# Patient Record
Sex: Female | Born: 2000 | Race: Black or African American | Hispanic: Yes | Marital: Married | Smoking: Former smoker
Health system: Southern US, Community
[De-identification: ages and names within clinical notes are randomized; demographics above are authoritative.]

## PROBLEM LIST (undated history)

## (undated) ENCOUNTER — Inpatient Hospital Stay (HOSPITAL_COMMUNITY): Payer: Self-pay

## (undated) DIAGNOSIS — O039 Complete or unspecified spontaneous abortion without complication: Secondary | ICD-10-CM

## (undated) DIAGNOSIS — J45909 Unspecified asthma, uncomplicated: Secondary | ICD-10-CM

## (undated) DIAGNOSIS — N83209 Unspecified ovarian cyst, unspecified side: Secondary | ICD-10-CM

## (undated) HISTORY — DX: Complete or unspecified spontaneous abortion without complication: O03.9

## (undated) HISTORY — DX: Unspecified ovarian cyst, unspecified side: N83.209

## (undated) HISTORY — PX: NO PAST SURGERIES: SHX2092

## (undated) HISTORY — PX: WISDOM TOOTH EXTRACTION: SHX21

---
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
## 2018-05-03 ENCOUNTER — Other Ambulatory Visit: Payer: Self-pay

## 2018-05-03 ENCOUNTER — Encounter: Payer: Self-pay | Admitting: Adult Health

## 2018-05-03 ENCOUNTER — Ambulatory Visit (INDEPENDENT_AMBULATORY_CARE_PROVIDER_SITE_OTHER): Payer: Medicaid Other | Admitting: Adult Health

## 2018-05-03 VITALS — BP 113/66 | HR 77 | Ht 63.0 in | Wt 127.0 lb

## 2018-05-03 DIAGNOSIS — Z3202 Encounter for pregnancy test, result negative: Secondary | ICD-10-CM

## 2018-05-03 DIAGNOSIS — Z113 Encounter for screening for infections with a predominantly sexual mode of transmission: Secondary | ICD-10-CM

## 2018-05-03 DIAGNOSIS — Z30011 Encounter for initial prescription of contraceptive pills: Secondary | ICD-10-CM

## 2018-05-03 DIAGNOSIS — H52203 Unspecified astigmatism, bilateral: Secondary | ICD-10-CM | POA: Diagnosis not present

## 2018-05-03 DIAGNOSIS — H5213 Myopia, bilateral: Secondary | ICD-10-CM | POA: Diagnosis not present

## 2018-05-03 LAB — POCT URINE PREGNANCY: PREG TEST UR: NEGATIVE

## 2018-05-03 MED ORDER — NORETHIN-ETH ESTRAD-FE BIPHAS 1 MG-10 MCG / 10 MCG PO TABS
1.0000 | ORAL_TABLET | Freq: Every day | ORAL | 11 refills | Status: DC
Start: 2018-05-03 — End: 2019-03-29

## 2018-05-03 NOTE — Progress Notes (Signed)
  Subjective:     Patient ID: Jasmine Pierce, female   DOB: 05/21/2001, 17 y.o.   MRN: [id number]  HPI Jasmine Pierce is a 17 year old black female, in requesting STD screening and to get on OCs, has used in past.No complaints.   Review of Systems Patient denies any headaches, hearing loss, fatigue, blurred vision, shortness of breath, chest pain, abdominal pain, problems with bowel movements, urination, or intercourse. No joint pain or mood swings. Reviewed past medical,surgical, social and family history. Reviewed medications and allergies.     Objective:   Physical Exam BP 113/66 (BP Location: Right Arm, Patient Position: Sitting, Cuff Size: Normal)   Pulse 77   Ht 5\' 3"  (1.6 m)   Wt 127 lb (57.6 kg)   LMP 04/13/2018   BMI 22.50 kg/m UPT negative.Skin warm and dry. Neck: mid line trachea, normal thyroid, good ROM, no lymphadenopathy noted. Lungs: clear to ausculation bilaterally. Cardiovascular: regular rate and rhythm. Pelvic: external genitalia is normal in appearance no lesions, vagina: scant white discharge without odor,urethra has no lesions or masses noted, cervix:smooth, uterus: normal size, shape and contour, non tender, no masses felt, adnexa: no masses or tenderness noted. Bladder is non tender and no masses felt.  Nuswab obtained. PHQ 2 score 0.     Assessment:     1. Encounter for initial prescription of contraceptive pills   2. Pregnancy examination or test, negative result   3. Screening examination for STD (sexually transmitted disease)       Plan:    1 pack lo loestrin given to start today, use condoms Meds ordered this encounter  Medications  . Norethindrone-Ethinyl Estradiol-Fe Biphas (LO LOESTRIN FE) 1 MG-10 MCG / 10 MCG tablet    Sig: Take 1 tablet by mouth daily. Take 1 daily by mouth    Dispense:  1 Package    Refill:  11    BIN [id number], PCN [id number], GRP [id number],ID [id number]    Order Specific Question:   Supervising Provider    Answer:   Lazaro ArmsEURE, LUTHER H [[id number]]   Nuswab sent Check HIV and RPR F/U in 3 months

## 2018-05-04 LAB — HIV ANTIBODY (ROUTINE TESTING W REFLEX): HIV SCREEN 4TH GENERATION: NONREACTIVE

## 2018-05-04 LAB — RPR: RPR Ser Ql: NONREACTIVE

## 2018-05-08 LAB — NUSWAB VAGINITIS PLUS (VG+)
Candida albicans, NAA: NEGATIVE
Candida glabrata, NAA: NEGATIVE
Chlamydia trachomatis, NAA: NEGATIVE
NEISSERIA GONORRHOEAE, NAA: NEGATIVE
Trich vag by NAA: NEGATIVE

## 2019-03-28 ENCOUNTER — Telehealth: Payer: Self-pay | Admitting: Adult Health

## 2019-03-28 NOTE — Telephone Encounter (Signed)
We have you scheduled for an upcoming appointment at our office. At this time, we are still not allowing any visitors or children to come in with you during your appointment time unless you need physical assistance. We understand this may be different from your past appointments and know this may be difficult but our goal is to keep everyone safe.  ° °We ask if you have had any exposure to anyone suspected or confirmed of having COVID-19 or if you are experiencing any of the following, to call and reschedule your appointment: fever, cough, shortness of breath, muscle pain, diarrhea, rash, vomiting, abdominal pain, red eye, weakness, bruising, bleeding, joint pain, or a severe headache.  ° °Also,to keep you safe, please use the provided hand sanitizer when you enter the office. We are asking everyone in the office to wear a mask to help prevent the spread of °germs. If you have a mask of your own, please wear it to your appointment, if not, we are happy to provide one for you. ° °We ask that you complete your E-check-in via mychart prior to your arrival and check-in via Hello Patient and call our office when you arrive in our office parking lot to complete your registration over the phone.  This is to help speed up the check-in process and reduce patient volume in our office.   ° °Please know we will ask you these questions or similar questions when you arrive for your appointment and again it’s how we are keeping everyone safe.  Thank you for understanding and your cooperation.  °

## 2019-03-29 ENCOUNTER — Other Ambulatory Visit: Payer: Self-pay

## 2019-03-29 ENCOUNTER — Ambulatory Visit (INDEPENDENT_AMBULATORY_CARE_PROVIDER_SITE_OTHER): Payer: Medicaid Other | Admitting: Adult Health

## 2019-03-29 ENCOUNTER — Encounter: Payer: Self-pay | Admitting: Adult Health

## 2019-03-29 VITALS — Ht 63.0 in

## 2019-03-29 DIAGNOSIS — Z3201 Encounter for pregnancy test, result positive: Secondary | ICD-10-CM | POA: Insufficient documentation

## 2019-03-29 DIAGNOSIS — O3680X0 Pregnancy with inconclusive fetal viability, not applicable or unspecified: Secondary | ICD-10-CM

## 2019-03-29 DIAGNOSIS — Z3A01 Less than 8 weeks gestation of pregnancy: Secondary | ICD-10-CM | POA: Insufficient documentation

## 2019-03-29 MED ORDER — PRENATAL PLUS 27-1 MG PO TABS: 1.0000 | ORAL_TABLET | Freq: Every day | ORAL | 12 refills | Status: DC

## 2019-03-29 NOTE — Progress Notes (Signed)
Patient ID: Jasmine Pierce, female   DOB: 07/26/2001, 18 y.o.   MRN: [id number]   TELEHEALTH VIRTUAL GYNECOLOGY VISIT ENCOUNTER NOTE  I connected with Jasmine Pierce on 03/31/19 at  1:45 PM EDT by telephone at home and verified that I am speaking with the correct person using two identifiers.   I discussed the limitations, risks, security and privacy concerns of performing an evaluation and management service by telephone and the availability of in person appointments. I also discussed with the patient that there may be a patient responsible charge related to this service. The patient expressed understanding and agreed to proceed.   History:  Jasmine Pierce is a 18 y.o. G1P0000,black female,single, being evaluated today for missed period and 5+HPTs, about 4+4 weeks by LMP 02/25/19, with EDD 12/02/19.She is having some stomach pain with increased activity, and headaches, some nausea too. She denies any abnormal vaginal discharge, bleeding,  or other concerns.   She works at [employer] and [employer].      Past Medical History:  Diagnosis Date  . Asthma   . Ovarian cyst    ruptured in 2016   History reviewed. No pertinent surgical history. The following portions of the patient's history were reviewed and updated as appropriate: allergies, current medications, past family history, past medical history, past social history, past surgical history and problem list.   Health Maintenance: Pap at 19   Review of Systems:  Pertinent items noted in HPI and remainder of comprehensive ROS otherwise negative.  Physical Exam:   General:  Alert, oriented and cooperative.   Mental Status: Normal mood and affect perceived. Normal judgment and thought content.  Physical exam deferred due to nature of the encounter Ht 5\' 3"  (1.6 m)   LMP 02/25/2019   BMI 22.50 kg/m per pt. Fall risk is low.  Labs and Imaging No results found for this or any previous visit (from the past 336 hour(s)). No results found.     Assessment and Plan:     1. Positive pregnancy test 5+HPTs  2. Less than [redacted] weeks gestation of pregnancy -eat often Meds ordered this encounter  Medications  . prenatal vitamin w/FE, FA (PRENATAL 1 + 1) 27-1 MG TABS tablet    Sig: Take 1 tablet by mouth daily at 12 noon.    Dispense:  30 tablet    Refill:  12    Order Specific Question:   Supervising Provider    Answer:   Elonda Husky, LUTHER H [[id number]]    3. Encounter to determine fetal viability of pregnancy, single or unspecified fetus Dating [place] in about 3 weeks  - US OB Comp Less 14 Wks; Future       I discussed the assessment and treatment plan with the patient. The patient was provided an opportunity to ask questions and all were answered. The patient agreed with the plan and demonstrated an understanding of the instructions.   The patient was advised to call back or seek an in-person evaluation/go to the ED if the symptoms worsen or if the condition fails to improve as anticipated.  I provided 10 minutes of non-face-to-face time during this encounter.   Derrek Monaco, NP [facility] [employer] [facility], [place]

## 2019-03-31 ENCOUNTER — Encounter (HOSPITAL_COMMUNITY): Payer: Self-pay | Admitting: Emergency Medicine

## 2019-03-31 ENCOUNTER — Emergency Department (HOSPITAL_COMMUNITY)
Admission: EM | Admit: 2019-03-31 | Discharge: 2019-03-31 | Disposition: A | Discharge status: Home / Self Care | Payer: Medicaid Other | Attending: Emergency Medicine | Admitting: Emergency Medicine

## 2019-03-31 ENCOUNTER — Other Ambulatory Visit: Payer: Self-pay

## 2019-03-31 ENCOUNTER — Emergency Department (HOSPITAL_COMMUNITY): Payer: Medicaid Other

## 2019-03-31 DIAGNOSIS — O26891 Other specified pregnancy related conditions, first trimester: Secondary | ICD-10-CM | POA: Diagnosis not present

## 2019-03-31 DIAGNOSIS — O200 Threatened abortion: Secondary | ICD-10-CM

## 2019-03-31 DIAGNOSIS — O99511 Diseases of the respiratory system complicating pregnancy, first trimester: Secondary | ICD-10-CM | POA: Diagnosis not present

## 2019-03-31 DIAGNOSIS — N898 Other specified noninflammatory disorders of vagina: Secondary | ICD-10-CM | POA: Diagnosis not present

## 2019-03-31 DIAGNOSIS — Z3A01 Less than 8 weeks gestation of pregnancy: Secondary | ICD-10-CM | POA: Diagnosis not present

## 2019-03-31 DIAGNOSIS — O9989 Other specified diseases and conditions complicating pregnancy, childbirth and the puerperium: Secondary | ICD-10-CM | POA: Diagnosis not present

## 2019-03-31 DIAGNOSIS — Z87891 Personal history of nicotine dependence: Secondary | ICD-10-CM | POA: Diagnosis not present

## 2019-03-31 DIAGNOSIS — R109 Unspecified abdominal pain: Secondary | ICD-10-CM

## 2019-03-31 DIAGNOSIS — J45909 Unspecified asthma, uncomplicated: Secondary | ICD-10-CM | POA: Insufficient documentation

## 2019-03-31 DIAGNOSIS — O209 Hemorrhage in early pregnancy, unspecified: Secondary | ICD-10-CM | POA: Diagnosis not present

## 2019-03-31 DIAGNOSIS — O4690 Antepartum hemorrhage, unspecified, unspecified trimester: Secondary | ICD-10-CM

## 2019-03-31 LAB — HCG, QUANTITATIVE, PREGNANCY: hCG, Beta Chain, Quant, S: 12 m[IU]/mL — ABNORMAL HIGH (ref <5)

## 2019-03-31 LAB — CBC WITH DIFFERENTIAL/PLATELET
Abs Immature Granulocytes: 0 10*3/uL (ref 0.00–0.07)
Basophils Absolute: 0 10*3/uL (ref 0.0–0.1)
Basophils Relative: 0 %
Eosinophils Absolute: 0.2 10*3/uL (ref 0.0–0.5)
Eosinophils Relative: 3 %
HCT: 40.3 % (ref 36.0–46.0)
Hemoglobin: 12.7 g/dL (ref 12.0–15.0)
Immature Granulocytes: 0 %
Lymphocytes Relative: 43 %
Lymphs Abs: 2.8 10*3/uL (ref 0.7–4.0)
MCH: 27.2 pg (ref 26.0–34.0)
MCHC: 31.5 g/dL (ref 30.0–36.0)
MCV: 86.3 fL (ref 80.0–100.0)
Monocytes Absolute: 0.4 10*3/uL (ref 0.1–1.0)
Monocytes Relative: 5 %
Neutro Abs: 3.3 10*3/uL (ref 1.7–7.7)
Neutrophils Relative %: 49 %
Platelets: 254 10*3/uL (ref 150–400)
RBC: 4.67 MIL/uL (ref 3.87–5.11)
RDW: 13.6 % (ref 11.5–15.5)
WBC: 6.7 10*3/uL (ref 4.0–10.5)
nRBC: 0 % (ref 0.0–0.2)

## 2019-03-31 LAB — URINALYSIS, ROUTINE W REFLEX MICROSCOPIC
Bilirubin Urine: NEGATIVE
Glucose, UA: NEGATIVE mg/dL
Ketones, ur: NEGATIVE mg/dL
Leukocytes,Ua: NEGATIVE
Nitrite: NEGATIVE
Protein, ur: NEGATIVE mg/dL
Specific Gravity, Urine: 1.004 — ABNORMAL LOW (ref 1.005–1.030)
pH: 6 (ref 5.0–8.0)

## 2019-03-31 LAB — ABO/RH: ABO/RH(D): A POS

## 2019-03-31 LAB — WET PREP, GENITAL
Clue Cells Wet Prep HPF POC: NONE SEEN
Sperm: NONE SEEN
Trich, Wet Prep: NONE SEEN
Yeast Wet Prep HPF POC: NONE SEEN

## 2019-03-31 LAB — POC URINE PREG, ED: Preg Test, Ur: NEGATIVE

## 2019-03-31 NOTE — ED Notes (Signed)
Pt returned from u/s

## 2019-03-31 NOTE — ED Notes (Signed)
Pt to US.

## 2019-03-31 NOTE — ED Triage Notes (Signed)
Pt reports she is 4 weeks and 6 days pregnant. States that about 20 minutes ago, she noticed pink spotting while wiping. Pt also reports pelvic cramping that began this morning. Pt's LMP around 5/17. Pt has not seen OB yet.

## 2019-03-31 NOTE — ED Provider Notes (Signed)
[facility] EMERGENCY DEPARTMENT Provider Note   CSN: [id number] Arrival date & time: 03/31/19  1711    History   Chief Complaint Chief Complaint  Patient presents with  . Vaginal Bleeding    HPI Jasmine Pierce is a 18 y.o. G1P0 female presents today for evaluation of cute onset, progressively worsening pelvic cramping and vaginal bleeding beginning today.  She reports that her last menstrual period was 02/25/2019.  She took a pregnancy test a few days ago on the first day of her missed period and it was positive.  She has had several positive pregnancy test since then.  She had an OB/GYN telemedicine visit on 03/29/2019 but no confirmatory ultrasound.  She reports that at around 4 PM today when she went to the bathroom she noticed some bright red blood with wiping.  She also reports of vaginal discharge which is not unusual for her.  She states that today she began to experience some cramping lower abdominal pain which has progressively worsened.  She has had some nausea for the last several days but no vomiting.  Denies fevers, diarrhea, constipation, or urinary symptoms. Reports small amount of vaginal bleeding, wearing a panti-liner presently but not saturated.      The history is provided by the patient.    Past Medical History:  Diagnosis Date  . Asthma   . Ovarian cyst    ruptured in 2016    Patient Active Problem List   Diagnosis Date Noted  . Encounter to determine fetal viability of pregnancy 03/29/2019  . Less than [redacted] weeks gestation of pregnancy 03/29/2019  . Positive pregnancy test 03/29/2019    History reviewed. No pertinent surgical history.   OB History    Gravida  1   Para  0   Term  0   Preterm  0   AB  0   Living  0     SAB  0   TAB  0   Ectopic  0   Multiple  0   Live Births  0            Home Medications    Prior to Admission medications   Medication Sig Start Date End Date Taking? Authorizing Provider  acetaminophen (TYLENOL)  500 MG tablet Take 500 mg by mouth daily as needed for mild pain or moderate pain.   Yes [provider]  prenatal vitamin w/FE, FA (PRENATAL 1 + 1) 27-1 MG TABS tablet Take 1 tablet by mouth daily at 12 noon. 03/29/19  Yes Estill Dooms, NP    Family History Family History  Problem Relation Age of Onset  . Hypertension Paternal Grandfather   . Diabetes Paternal Grandfather   . Diabetes Paternal Grandmother   . Hypertension Paternal Grandmother   . Hypertension Maternal Grandmother   . Graves' disease Father   . Sickle cell trait Mother   . Chronic Renal Failure Other   . Cancer Other        cervical  . Colon cancer Other   . Asthma Maternal Aunt     Social History Social History   Tobacco Use  . Smoking status: Former Smoker    Types: Cigarettes  . Smokeless tobacco: Never Used  . Tobacco comment: Quit "months ago"   Substance Use Topics  . Alcohol use: No  . Drug use: No     Allergies   Patient has no known allergies.   Review of Systems Review of Systems  Constitutional: Negative for  chills and fever.  Respiratory: Negative for shortness of breath.   Cardiovascular: Negative for chest pain.  Gastrointestinal: Positive for nausea. Negative for constipation, diarrhea and vomiting.  Genitourinary: Positive for pelvic pain, vaginal bleeding and vaginal discharge.  All other systems reviewed and are negative.    Physical Exam Updated Vital Signs BP 128/69 (BP Location: Right Arm)   Pulse 77   Temp 98.6 F (37 C) (Oral)   Resp 12   Ht 5\' 3"  (1.6 m)   Wt 54.4 kg   LMP 02/25/2019   SpO2 100%   BMI 21.26 kg/m   Physical Exam Vitals signs and nursing note reviewed. Exam conducted with a chaperone present.  Constitutional:      General: She is not in acute distress.    Appearance: She is well-developed.     Comments: Resting comfortably in bed  HENT:     Head: Normocephalic and atraumatic.  Eyes:     General:        Right eye: No  discharge.        Left eye: No discharge.     Conjunctiva/sclera: Conjunctivae normal.  Neck:     Vascular: No JVD.     Trachea: No tracheal deviation.  Cardiovascular:     Rate and Rhythm: Normal rate and regular rhythm.  Pulmonary:     Effort: Pulmonary effort is normal.     Breath sounds: Normal breath sounds.  Abdominal:     General: Abdomen is flat. Bowel sounds are normal. There is no distension.     Palpations: Abdomen is soft.     Tenderness: There is no abdominal tenderness. There is no right CVA tenderness, left CVA tenderness, guarding or rebound.  Genitourinary:    Comments: Examination performed in the presence of a chaperone.  No masses or lesions to the external genitalia.  Moderate amount of dark blood in the vaginal vault. Cervical os is closed. No CMT or adnexal tenderness.  Skin:    General: Skin is warm and dry.     Findings: No erythema.  Neurological:     Mental Status: She is alert.  Psychiatric:        Behavior: Behavior normal.      ED Treatments / Results  Labs (all labs ordered are listed, but only abnormal results are displayed) Labs Reviewed  WET PREP, GENITAL - Abnormal; Notable for the following components:      Result Value   WBC, Wet Prep HPF POC MANY (*)    All other components within normal limits  HCG, QUANTITATIVE, PREGNANCY - Abnormal; Notable for the following components:   hCG, Beta Chain, Quant, S 12 (*)    All other components within normal limits  URINALYSIS, ROUTINE W REFLEX MICROSCOPIC - Abnormal; Notable for the following components:   Color, Urine STRAW (*)    Specific Gravity, Urine 1.004 (*)    Hgb urine dipstick SMALL (*)    Bacteria, UA RARE (*)    All other components within normal limits  CBC WITH DIFFERENTIAL/PLATELET  POC URINE PREG, ED  ABO/RH  GC/CHLAMYDIA PROBE AMP ([facility]) NOT AT [facility]    EKG None  Radiology [place] Ob Comp < 14 Wks  Result Date: 03/31/2019 CLINICAL DATA:  18 year old pregnant female  with vaginal bleeding. Estimated gestational age of [redacted] weeks 6 days by LMP. Beta HCG of 12. EXAM: OBSTETRIC <14 WK US AND TRANSVAGINAL OB US TECHNIQUE: Both transabdominal and transvaginal ultrasound examinations were performed for complete evaluation of  the gestation as well as the maternal uterus, adnexal regions, and pelvic cul-de-sac. Transvaginal technique was performed to assess early pregnancy. COMPARISON:  None. FINDINGS: Intrauterine gestational sac: None Yolk sac:  Not Visualized. Embryo:  Not Visualized. Subchorionic hemorrhage:  None visualized. Maternal uterus/adnexae: A small amount of fluid in the LOWER uterine segment/cervical canal noted. No adnexal mass or free fluid. The ovaries are unremarkable. IMPRESSION: 1. No IUP or adnexal mass visualized. Differential diagnosis includes recent spontaneous abortion, IUP too early to visualize, and occult ectopic pregnancy. Recommend close follow up of quantitative B-HCG levels, and follow up US as clinically warranted. Electronically Signed   By: Harmon PierJeffrey  Hu M.D.   On: 03/31/2019 20:39   [place] Ob Transvaginal  Result Date: 03/31/2019 CLINICAL DATA:  18 year old pregnant female with vaginal bleeding. Estimated gestational age of [redacted] weeks 6 days by LMP. Beta HCG of 12. EXAM: OBSTETRIC <14 WK US AND TRANSVAGINAL OB US TECHNIQUE: Both transabdominal and transvaginal ultrasound examinations were performed for complete evaluation of the gestation as well as the maternal uterus, adnexal regions, and pelvic cul-de-sac. Transvaginal technique was performed to assess early pregnancy. COMPARISON:  None. FINDINGS: Intrauterine gestational sac: None Yolk sac:  Not Visualized. Embryo:  Not Visualized. Subchorionic hemorrhage:  None visualized. Maternal uterus/adnexae: A small amount of fluid in the LOWER uterine segment/cervical canal noted. No adnexal mass or free fluid. The ovaries are unremarkable. IMPRESSION: 1. No IUP or adnexal mass visualized. Differential diagnosis  includes recent spontaneous abortion, IUP too early to visualize, and occult ectopic pregnancy. Recommend close follow up of quantitative B-HCG levels, and follow up US as clinically warranted. Electronically Signed   By: Harmon PierJeffrey  Hu M.D.   On: 03/31/2019 20:39    Procedures Procedures (including critical care time)  Medications Ordered in ED Medications - No data to display   Initial Impression / Assessment and Plan / ED Course  I have reviewed the triage vital signs and the nursing notes.  Pertinent labs & imaging results that were available during my care of the patient were reviewed by me and considered in my medical decision making (see chart for details).         Patient presenting for evaluation of vaginal bleeding and lower abdominal cramping that began today.  She believes she is approximately [redacted] weeks pregnant and has seen OB/GYN via telemedicine but has not had a confirmatory ultrasound.  She is afebrile, vital signs are stable.  She is nontoxic in appearance.  Her abdominal examination is completely benign with no tenderness or peritoneal signs.  Lab work reviewed by me shows no leukocytosis, no anemia.  Type is a positive so she does not require RhoGam.  UA does not suggest UTI or nephrolithiasis.  Her quantitative hCG is 12 which is quite low so she is either miscarrying or not quite as far along as she thinks.  No concern for PID on pelvic examination today.  Pelvic ultrasound shows no IUP or adnexal mass visualized.  I have a very low suspicion of ectopic pregnancy or ovarian torsion given reassuring examination.  Radiology recommends close follow-up of quantitative beta-hCG levels and follow-up ultrasound.  Differential includes threatened miscarriage, recent spontaneous abortion, or IUP is too early to visualize.  On reevaluation patient is resting comfortably in no apparent distress.  She declined any nausea medicine or pain medicine while in the ED today.  Recommend close  follow-up with OB/GYN for beta-hCG trending and possible repeat ultrasound.  Discussed strict ED return precautions. Patient verbalized  understanding of and agreement with plan and is safe for discharge home at this time.   Final Clinical Impressions(s) / ED Diagnoses   Final diagnoses:  Vaginal bleeding in pregnancy  Abdominal cramping  Threatened miscarriage    ED Discharge Orders    None       Bennye AlmFawze, Kelsee Preslar A, PA-C 03/31/19 2109    Vanetta MuldersZackowski, Scott, MD 04/01/19 1842

## 2019-03-31 NOTE — Discharge Instructions (Signed)
You can take 500 to 1000 mg of Tylenol every 6 hours as needed for pain/cramping.  Drink plenty fluids and get plenty of rest.  Follow-up with your OB/GYN for repeat hCG blood test in 2 days. Your HCG levels today were low so we need to trend them to see if they increase or decrease.   Return to the emergency department if any concerning signs or symptoms develop such as severe abdominal pain, fevers, or persistent vomiting.

## 2019-04-01 ENCOUNTER — Other Ambulatory Visit: Payer: Self-pay

## 2019-04-01 ENCOUNTER — Emergency Department (HOSPITAL_COMMUNITY)
Admission: EM | Admit: 2019-04-01 | Discharge: 2019-04-01 | Disposition: A | Discharge status: Home / Self Care | Payer: Medicaid Other | Attending: Emergency Medicine | Admitting: Emergency Medicine

## 2019-04-01 ENCOUNTER — Encounter (HOSPITAL_COMMUNITY): Payer: Self-pay | Admitting: Emergency Medicine

## 2019-04-01 DIAGNOSIS — Z87891 Personal history of nicotine dependence: Secondary | ICD-10-CM | POA: Diagnosis not present

## 2019-04-01 DIAGNOSIS — N939 Abnormal uterine and vaginal bleeding, unspecified: Secondary | ICD-10-CM | POA: Insufficient documentation

## 2019-04-01 DIAGNOSIS — J45909 Unspecified asthma, uncomplicated: Secondary | ICD-10-CM | POA: Insufficient documentation

## 2019-04-01 LAB — POC URINE PREG, ED: Preg Test, Ur: NEGATIVE

## 2019-04-01 NOTE — Discharge Instructions (Signed)
Your pregnancy test is negative.  Please follow-up with your OB/GYN in the office.  Return for menstrual bleeding that is greater than your normal menstrual cycle feel lightheaded if you have sudden worsening one-sided abdominal pain.

## 2019-04-01 NOTE — ED Provider Notes (Signed)
[facility] EMERGENCY DEPARTMENT Provider Note   CSN: [id number] Arrival date & time: 04/01/19  1127    History   Chief Complaint Chief Complaint  Patient presents with   Vaginal Bleeding    HPI Elliet Angus Seller is a 18 y.o. female.     18 yo F with a chief complaint of vaginal bleeding.  The patient had a positive pregnancy test at home and then came to the emergency department yesterday due to vaginal bleeding.  At that time her hCG was less than 20 and her urine pregnancy test was negative.  She had a formal ultrasound that was negative for intrauterine pregnancy and there was no ectopic that was identified.  The patient was told to come back if her bleeding got worse.  Her bleeding and pelvic cramping got mildly worse overnight and so she is back for evaluation.  This that her bleeding is about the same as her normal menstrual cycle.  Cramping is about the same as well.  The history is provided by the patient.  Vaginal Bleeding Quality:  Clots Severity:  Mild Onset quality:  Gradual Duration:  2 days Timing:  Constant Progression:  Worsening Chronicity:  New Possible pregnancy: yes   Relieved by:  Nothing Worsened by:  Nothing Ineffective treatments:  None tried Associated symptoms: no dizziness, no dysuria, no fever and no nausea     Past Medical History:  Diagnosis Date   Asthma    Ovarian cyst    ruptured in 2016    Patient Active Problem List   Diagnosis Date Noted   Encounter to determine fetal viability of pregnancy 03/29/2019   Less than [redacted] weeks gestation of pregnancy 03/29/2019   Positive pregnancy test 03/29/2019    No past surgical history on file.   OB History    Gravida  1   Para  0   Term  0   Preterm  0   AB  0   Living  0     SAB  0   TAB  0   Ectopic  0   Multiple  0   Live Births  0            Home Medications    Prior to Admission medications   Medication Sig Start Date End Date Taking? Authorizing  Provider  acetaminophen (TYLENOL) 500 MG tablet Take 500 mg by mouth daily as needed for mild pain or moderate pain.   Yes [provider]  prenatal vitamin w/FE, FA (PRENATAL 1 + 1) 27-1 MG TABS tablet Take 1 tablet by mouth daily at 12 noon. 03/29/19  Yes Estill Dooms, NP    Family History Family History  Problem Relation Age of Onset   Hypertension Paternal Grandfather    Diabetes Paternal Grandfather    Diabetes Paternal Grandmother    Hypertension Paternal Grandmother    Hypertension Maternal Grandmother    Graves' disease Father    Sickle cell trait Mother    Chronic Renal Failure Other    Cancer Other        cervical   Colon cancer Other    Asthma Maternal Aunt     Social History Social History   Tobacco Use   Smoking status: Former Smoker    Types: Cigarettes   Smokeless tobacco: Never Used   Tobacco comment: Quit "months ago"   Substance Use Topics   Alcohol use: No   Drug use: No     Allergies   Patient  has no known allergies.   Review of Systems Review of Systems  Constitutional: Negative for chills and fever.  HENT: Negative for congestion and rhinorrhea.   Eyes: Negative for redness and visual disturbance.  Respiratory: Negative for shortness of breath and wheezing.   Cardiovascular: Negative for chest pain and palpitations.  Gastrointestinal: Negative for nausea and vomiting.  Genitourinary: Positive for vaginal bleeding. Negative for dysuria and urgency.  Musculoskeletal: Negative for arthralgias and myalgias.  Skin: Negative for pallor and wound.  Neurological: Negative for dizziness and headaches.     Physical Exam Updated Vital Signs BP 121/64 (BP Location: Right Arm)    Pulse 76    Temp 98.1 F (36.7 C) (Oral)    Resp 16    Ht 5\' 3"  (1.6 m)    Wt 54.4 kg    LMP 02/25/2019    SpO2 100%    BMI 21.26 kg/m   Physical Exam Vitals signs and nursing note reviewed.  Constitutional:      General: She is not in  acute distress.    Appearance: She is well-developed. She is not diaphoretic.  HENT:     Head: Normocephalic and atraumatic.  Eyes:     Pupils: Pupils are equal, round, and reactive to light.  Neck:     Musculoskeletal: Normal range of motion and neck supple.  Cardiovascular:     Rate and Rhythm: Normal rate and regular rhythm.     Heart sounds: No murmur. No friction rub. No gallop.   Pulmonary:     Effort: Pulmonary effort is normal.     Breath sounds: No wheezing or rales.  Abdominal:     General: There is no distension.     Palpations: Abdomen is soft.     Tenderness: There is no abdominal tenderness.     Comments: Very mild suprapubic tenderness   Musculoskeletal:        General: No tenderness.  Skin:    General: Skin is warm and dry.  Neurological:     Mental Status: She is alert and oriented to person, place, and time.  Psychiatric:        Behavior: Behavior normal.      ED Treatments / Results  Labs (all labs ordered are listed, but only abnormal results are displayed) Labs Reviewed  POC URINE PREG, ED    EKG None  Radiology [place] Ob Comp < 14 Wks  Result Date: 03/31/2019 CLINICAL DATA:  18 year old pregnant female with vaginal bleeding. Estimated gestational age of [redacted] weeks 6 days by LMP. Beta HCG of 12. EXAM: OBSTETRIC <14 WK US AND TRANSVAGINAL OB US TECHNIQUE: Both transabdominal and transvaginal ultrasound examinations were performed for complete evaluation of the gestation as well as the maternal uterus, adnexal regions, and pelvic cul-de-sac. Transvaginal technique was performed to assess early pregnancy. COMPARISON:  None. FINDINGS: Intrauterine gestational sac: None Yolk sac:  Not Visualized. Embryo:  Not Visualized. Subchorionic hemorrhage:  None visualized. Maternal uterus/adnexae: A small amount of fluid in the LOWER uterine segment/cervical canal noted. No adnexal mass or free fluid. The ovaries are unremarkable. IMPRESSION: 1. No IUP or adnexal mass  visualized. Differential diagnosis includes recent spontaneous abortion, IUP too early to visualize, and occult ectopic pregnancy. Recommend close follow up of quantitative B-HCG levels, and follow up US as clinically warranted. Electronically Signed   By: Harmon PierJeffrey  Hu M.D.   On: 03/31/2019 20:39   [place] Ob Transvaginal  Result Date: 03/31/2019 CLINICAL DATA:  18 year old pregnant female with vaginal  bleeding. Estimated gestational age of [redacted] weeks 6 days by LMP. Beta HCG of 12. EXAM: OBSTETRIC <14 WK US AND TRANSVAGINAL OB US TECHNIQUE: Both transabdominal and transvaginal ultrasound examinations were performed for complete evaluation of the gestation as well as the maternal uterus, adnexal regions, and pelvic cul-de-sac. Transvaginal technique was performed to assess early pregnancy. COMPARISON:  None. FINDINGS: Intrauterine gestational sac: None Yolk sac:  Not Visualized. Embryo:  Not Visualized. Subchorionic hemorrhage:  None visualized. Maternal uterus/adnexae: A small amount of fluid in the LOWER uterine segment/cervical canal noted. No adnexal mass or free fluid. The ovaries are unremarkable. IMPRESSION: 1. No IUP or adnexal mass visualized. Differential diagnosis includes recent spontaneous abortion, IUP too early to visualize, and occult ectopic pregnancy. Recommend close follow up of quantitative B-HCG levels, and follow up US as clinically warranted. Electronically Signed   By: Harmon PierJeffrey  Hu M.D.   On: 03/31/2019 20:39    Procedures Procedures (including critical care time)  Medications Ordered in ED Medications - No data to display   Initial Impression / Assessment and Plan / ED Course  I have reviewed the triage vital signs and the nursing notes.  Pertinent labs & imaging results that were available during my care of the patient were reviewed by me and considered in my medical decision making (see chart for details).        18 yo F with a chief complaint of vaginal bleeding.  This is  still about the same as her normal menstrual cycle.  She is clinically well-appearing and nontoxic I doubt that she has a significant anemia.  Will obtain a urine pregnancy test if this is negative then I will discharge her home with a presumed miscarriage.  OB/GYN follow-up.  12:37 PM:  I have discussed the diagnosis/risks/treatment options with the patient and believe the pt to be eligible for discharge home to follow-up with GYN. We also discussed returning to the ED immediately if new or worsening sx occur. We discussed the sx which are most concerning (e.g., sudden worsening pain, fever, inability to tolerate by mouth) that necessitate immediate return. Medications administered to the patient during their visit and any new prescriptions provided to the patient are listed below.  Medications given during this visit Medications - No data to display   The patient appears reasonably screen and/or stabilized for discharge and I doubt any other medical condition or other [facility] requiring further screening, evaluation, or treatment in the ED at this time prior to discharge.    Final Clinical Impressions(s) / ED Diagnoses   Final diagnoses:  Vaginal bleeding    ED Discharge Orders    None       Melene PlanFloyd, Naftula Donahue, DO 04/01/19 1237

## 2019-04-01 NOTE — ED Triage Notes (Signed)
Patient seen here in ED yesterday for vaginal bleeding at [redacted] weeks pregnant. Patient had ultrasound and blood work. Patient told she was having a miscarriage and to return to ED if bleeding increases. Per patient bleeding has increased with small blood clots. Denies increase in pain or any new symptoms.

## 2019-04-02 ENCOUNTER — Telehealth: Payer: Self-pay | Admitting: Adult Health

## 2019-04-02 ENCOUNTER — Telehealth: Payer: Self-pay | Admitting: *Deleted

## 2019-04-02 DIAGNOSIS — O209 Hemorrhage in early pregnancy, unspecified: Secondary | ICD-10-CM

## 2019-04-02 LAB — GC/CHLAMYDIA PROBE AMP (~~LOC~~) NOT AT ARMC
Chlamydia: NEGATIVE
Neisseria Gonorrhea: NEGATIVE

## 2019-04-02 MED ORDER — IBUPROFEN 800 MG PO TABS: 800.0000 mg | ORAL_TABLET | Freq: Three times a day (TID) | ORAL | 0 refills | Status: DC | PRN

## 2019-04-02 NOTE — Telephone Encounter (Signed)
Left message that I reviewed her note from ER, and she needs follow up [facility] the order is in for tomorrow, and go to [email or web address] to make appt

## 2019-04-02 NOTE — Telephone Encounter (Signed)
Tish, RN spoke with pt. Encounter closed. [place]

## 2019-04-02 NOTE — Addendum Note (Signed)
Addended by: Derrek Monaco A on: 04/02/2019 01:04 PM   Modules accepted: Orders

## 2019-04-02 NOTE — Telephone Encounter (Signed)
Pt states she was in the hospital on the 20-21st and they told her to have an appt today for a threatned miscarriage. Pt requesting the nurse to call.

## 2019-04-02 NOTE — Telephone Encounter (Signed)
Pt would like to see if medication can be sent in for her as the tyelnol is helping with her pain.

## 2019-04-02 NOTE — Telephone Encounter (Signed)
Patient informed she needs repeat HCG level tomorrow.  States she is having a lot of bleeding and lower abdominal/pelvic cramping. She has tried tylenol but it is not helping.  She is asking for something stronger for pain.  Advised to check back with pharmacy later today if she did not hear from [place].  Verbalized understanding.

## 2019-04-02 NOTE — Telephone Encounter (Signed)
Will rx motrin 800 mg 

## 2019-04-03 DIAGNOSIS — O209 Hemorrhage in early pregnancy, unspecified: Secondary | ICD-10-CM | POA: Diagnosis not present

## 2019-04-04 ENCOUNTER — Telehealth: Payer: Self-pay | Admitting: *Deleted

## 2019-04-04 LAB — BETA HCG QUANT (REF LAB): hCG Quant: 2 m[IU]/mL

## 2019-04-04 NOTE — Telephone Encounter (Signed)
Patient informed that Hcg is down to 2 and does not need to have f/u levels.  U/S for 7/9 has been cancelled.  Pt verbalized understanding. States her bleeding and cramping are much better now.

## 2019-04-19 ENCOUNTER — Other Ambulatory Visit: Payer: Medicaid Other

## 2019-04-25 ENCOUNTER — Other Ambulatory Visit: Payer: Medicaid Other

## 2019-04-25 ENCOUNTER — Other Ambulatory Visit: Payer: Self-pay

## 2019-04-25 DIAGNOSIS — Z20822 Contact with and (suspected) exposure to covid-19: Secondary | ICD-10-CM

## 2019-04-25 DIAGNOSIS — R6889 Other general symptoms and signs: Secondary | ICD-10-CM | POA: Diagnosis not present

## 2019-04-29 LAB — NOVEL CORONAVIRUS, NAA: SARS-CoV-2, NAA: NOT DETECTED

## 2019-05-07 DIAGNOSIS — H52203 Unspecified astigmatism, bilateral: Secondary | ICD-10-CM | POA: Diagnosis not present

## 2019-05-07 DIAGNOSIS — H5213 Myopia, bilateral: Secondary | ICD-10-CM | POA: Diagnosis not present

## 2019-05-28 ENCOUNTER — Telehealth: Payer: Self-pay | Admitting: Women's Health

## 2019-05-28 NOTE — Telephone Encounter (Signed)
Patient states she needs and u/s and bloodwork since she had a miscarriage.  Informed patient she does not need a f/u for this as her HCG last was 2.  Pt states says her last period was June 11 and has taken several pregnancy tests that are positive.  Informed patient we will get her scheduled for pregnancy test visit over the phone and then provider will get her scheduled for dating u/s at that time. Pt verbalized understanding.

## 2019-05-28 NOTE — Telephone Encounter (Signed)
Patient called, she stated she had a miscarriage in June.  She is requesting an ultrasound and labs to test her HCG.   [phone]

## 2019-05-30 ENCOUNTER — Emergency Department (HOSPITAL_COMMUNITY): Payer: Medicaid Other

## 2019-05-30 ENCOUNTER — Encounter (HOSPITAL_COMMUNITY): Payer: Self-pay | Admitting: Emergency Medicine

## 2019-05-30 ENCOUNTER — Emergency Department (HOSPITAL_COMMUNITY)
Admission: EM | Admit: 2019-05-30 | Discharge: 2019-05-30 | Disposition: A | Discharge status: Home / Self Care | Payer: Medicaid Other | Attending: Emergency Medicine | Admitting: Emergency Medicine

## 2019-05-30 ENCOUNTER — Other Ambulatory Visit: Payer: Self-pay

## 2019-05-30 DIAGNOSIS — Z3A00 Weeks of gestation of pregnancy not specified: Secondary | ICD-10-CM | POA: Diagnosis not present

## 2019-05-30 DIAGNOSIS — O99511 Diseases of the respiratory system complicating pregnancy, first trimester: Secondary | ICD-10-CM | POA: Insufficient documentation

## 2019-05-30 DIAGNOSIS — Z87891 Personal history of nicotine dependence: Secondary | ICD-10-CM | POA: Insufficient documentation

## 2019-05-30 DIAGNOSIS — J45909 Unspecified asthma, uncomplicated: Secondary | ICD-10-CM | POA: Insufficient documentation

## 2019-05-30 DIAGNOSIS — R1030 Lower abdominal pain, unspecified: Secondary | ICD-10-CM | POA: Diagnosis not present

## 2019-05-30 DIAGNOSIS — O3680X0 Pregnancy with inconclusive fetal viability, not applicable or unspecified: Secondary | ICD-10-CM | POA: Insufficient documentation

## 2019-05-30 DIAGNOSIS — Z3A01 Less than 8 weeks gestation of pregnancy: Secondary | ICD-10-CM | POA: Diagnosis not present

## 2019-05-30 DIAGNOSIS — O26891 Other specified pregnancy related conditions, first trimester: Secondary | ICD-10-CM | POA: Diagnosis not present

## 2019-05-30 DIAGNOSIS — R102 Pelvic and perineal pain: Secondary | ICD-10-CM | POA: Diagnosis present

## 2019-05-30 LAB — COMPREHENSIVE METABOLIC PANEL
ALT: 15 U/L (ref 0–44)
AST: 23 U/L (ref 15–41)
Albumin: 4.2 g/dL (ref 3.5–5.0)
Alkaline Phosphatase: 51 U/L (ref 38–126)
Anion gap: 11 (ref 5–15)
BUN: 12 mg/dL (ref 6–20)
CO2: 19 mmol/L — ABNORMAL LOW (ref 22–32)
Calcium: 9.1 mg/dL (ref 8.9–10.3)
Chloride: 105 mmol/L (ref 98–111)
Creatinine, Ser: 0.6 mg/dL (ref 0.44–1.00)
GFR calc Af Amer: >60 mL/min (ref >60)
GFR calc non Af Amer: >60 mL/min (ref >60)
Glucose, Bld: 82 mg/dL (ref 70–99)
Potassium: 3.6 mmol/L (ref 3.5–5.1)
Sodium: 135 mmol/L (ref 135–145)
Total Bilirubin: 0.3 mg/dL (ref 0.3–1.2)
Total Protein: 7.7 g/dL (ref 6.5–8.1)

## 2019-05-30 LAB — HCG, QUANTITATIVE, PREGNANCY: hCG, Beta Chain, Quant, S: 3222 m[IU]/mL — ABNORMAL HIGH (ref <5)

## 2019-05-30 LAB — CBC WITH DIFFERENTIAL/PLATELET
Abs Immature Granulocytes: 0.01 10*3/uL (ref 0.00–0.07)
Basophils Absolute: 0 10*3/uL (ref 0.0–0.1)
Basophils Relative: 0 %
Eosinophils Absolute: 0.2 10*3/uL (ref 0.0–0.5)
Eosinophils Relative: 2 %
HCT: 40.5 % (ref 36.0–46.0)
Hemoglobin: 13 g/dL (ref 12.0–15.0)
Immature Granulocytes: 0 %
Lymphocytes Relative: 35 %
Lymphs Abs: 2.9 10*3/uL (ref 0.7–4.0)
MCH: 27.8 pg (ref 26.0–34.0)
MCHC: 32.1 g/dL (ref 30.0–36.0)
MCV: 86.7 fL (ref 80.0–100.0)
Monocytes Absolute: 0.4 10*3/uL (ref 0.1–1.0)
Monocytes Relative: 5 %
Neutro Abs: 4.8 10*3/uL (ref 1.7–7.7)
Neutrophils Relative %: 58 %
Platelets: 276 10*3/uL (ref 150–400)
RBC: 4.67 MIL/uL (ref 3.87–5.11)
RDW: 13.5 % (ref 11.5–15.5)
WBC: 8.4 10*3/uL (ref 4.0–10.5)
nRBC: 0 % (ref 0.0–0.2)

## 2019-05-30 LAB — URINALYSIS, ROUTINE W REFLEX MICROSCOPIC
Bilirubin Urine: NEGATIVE
Glucose, UA: NEGATIVE mg/dL
Hgb urine dipstick: NEGATIVE
Ketones, ur: 5 mg/dL — AB
Leukocytes,Ua: NEGATIVE
Nitrite: NEGATIVE
Protein, ur: NEGATIVE mg/dL
Specific Gravity, Urine: 1.015 (ref 1.005–1.030)
pH: 6 (ref 5.0–8.0)

## 2019-05-30 NOTE — ED Provider Notes (Signed)
Emergency Department Provider Note   I have reviewed the triage vital signs and the nursing notes.   HISTORY  Chief Complaint Pelvic Pain   HPI Jasmine Pierce is a 18 y.o. female G2P0 at [redacted]w[redacted]d by dates (LMP July 17th) presents to the emergency department for evaluation of lower abdominal pain.  Pain began yesterday but worsened today with some lifting.  She has not experienced any vaginal bleeding or discharge.  No dysuria, hesitancy, urgency.  She has called to establish care with a family tree here in [place] but has not had her first appointment.  Denies any fevers or chills.  No radiation of pain or other modifying factors.  Denies chest pain or shortness of breath.  Past Medical History:  Diagnosis Date  . Asthma   . Ovarian cyst    ruptured in 2016    Patient Active Problem List   Diagnosis Date Noted  . Encounter to determine fetal viability of pregnancy 03/29/2019  . Less than [redacted] weeks gestation of pregnancy 03/29/2019  . Positive pregnancy test 03/29/2019    History reviewed. No pertinent surgical history.  Allergies Patient has no known allergies.  Family History  Problem Relation Age of Onset  . Hypertension Paternal Grandfather   . Diabetes Paternal Grandfather   . Diabetes Paternal Grandmother   . Hypertension Paternal Grandmother   . Hypertension Maternal Grandmother   . Graves' disease Father   . Sickle cell trait Mother   . Chronic Renal Failure Other   . Cancer Other        cervical  . Colon cancer Other   . Asthma Maternal Aunt     Social History Social History   Tobacco Use  . Smoking status: Former Smoker    Types: Cigarettes  . Smokeless tobacco: Never Used  . Tobacco comment: Quit "months ago"   Substance Use Topics  . Alcohol use: No  . Drug use: No    Review of Systems  Constitutional: No fever/chills Eyes: No visual changes. ENT: No sore throat. Cardiovascular: Denies chest pain. Respiratory: Denies shortness of  breath. Gastrointestinal: Positive lower abdominal/pelvic pain.  No nausea, no vomiting.  No diarrhea.  No constipation. Genitourinary: Negative for dysuria. Musculoskeletal: Negative for back pain. Skin: Negative for rash. Neurological: Negative for headaches, focal weakness or numbness.  10-point ROS otherwise negative.  ____________________________________________   PHYSICAL EXAM:  VITAL SIGNS: ED Triage Vitals [05/30/19 1852]  Enc Vitals Group     BP 121/78     Pulse Rate (!) 105     Resp 16     Temp 98.7 F (37.1 C)     Temp Source Oral     SpO2 100 %   Constitutional: Alert and oriented. Well appearing and in no acute distress. Eyes: Conjunctivae are normal.  Head: Atraumatic. Nose: No congestion/rhinnorhea. Mouth/Throat: Mucous membranes are moist.  Neck: No stridor.  Cardiovascular: Tachycardia. Good peripheral circulation. Grossly normal heart sounds.   Respiratory: Normal respiratory effort.  No retractions. Lungs CTAB. Gastrointestinal: Soft and nontender. No distention.  Musculoskeletal: No lower extremity tenderness nor edema. No gross deformities of extremities. Neurologic:  Normal speech and language. No gross focal neurologic deficits are appreciated.  Skin:  Skin is warm, dry and intact. No rash noted.  ____________________________________________   LABS (all labs ordered are listed, but only abnormal results are displayed)  Labs Reviewed  HCG, QUANTITATIVE, PREGNANCY - Abnormal; Notable for the following components:      Result Value   hCG,  Beta Chain, Quant, S 3,222 (*)    All other components within normal limits  URINALYSIS, ROUTINE W REFLEX MICROSCOPIC - Abnormal; Notable for the following components:   Ketones, ur 5 (*)    All other components within normal limits  COMPREHENSIVE METABOLIC PANEL - Abnormal; Notable for the following components:   CO2 19 (*)    All other components within normal limits  CBC WITH DIFFERENTIAL/PLATELET    ____________________________________________  RADIOLOGY  TVUS preliminary read reviewed.  ____________________________________________   PROCEDURES  Procedure(s) performed:   Procedures  None ____________________________________________   INITIAL IMPRESSION / ASSESSMENT AND PLAN / ED COURSE  Pertinent labs & imaging results that were available during my care of the patient were reviewed by me and considered in my medical decision making (see chart for details).   Patient with early pregnancy and lower abdominal pain. No bleeding or discharge. No fever. No flank pain. Labs reviewed. No UTI. TVUS with no definite IUP but suspected. Will call OB in the AM to arrange f/u beta hCG. Discussed plan and patient is in agreement. Discussed ED return precautions.    ____________________________________________  FINAL CLINICAL IMPRESSION(S) / ED DIAGNOSES  Final diagnoses:  Pregnancy of unknown anatomic location    Note:  This document was prepared using Dragon voice recognition software and may include unintentional dictation errors.  Alona BeneJoshua , MD Emergency Medicine    , Arlyss RepressJoshua G, MD 05/31/19 [phone]

## 2019-05-30 NOTE — Discharge Instructions (Signed)
You were seen in the emergency department today with pelvic pain and pregnancy.  Your ultrasound did not definitely identify a pregnancy in the uterus but did show an area that could be pregnancy.  You will need repeat blood testing through your OB/GYN.  Please call them tomorrow to schedule a follow-up beta hCG test to be performed in the next several days.  Return to the emergency department if you develop any new or worsening symptoms.

## 2019-05-30 NOTE — ED Triage Notes (Signed)
Patient reports pelvic pain that started yesterday. Denies discharge.

## 2019-05-31 ENCOUNTER — Other Ambulatory Visit: Payer: Medicaid Other

## 2019-05-31 ENCOUNTER — Other Ambulatory Visit: Payer: Self-pay

## 2019-05-31 ENCOUNTER — Telehealth: Payer: Self-pay | Admitting: Women's Health

## 2019-05-31 DIAGNOSIS — H5213 Myopia, bilateral: Secondary | ICD-10-CM | POA: Diagnosis not present

## 2019-05-31 DIAGNOSIS — O3680X0 Pregnancy with inconclusive fetal viability, not applicable or unspecified: Secondary | ICD-10-CM | POA: Diagnosis not present

## 2019-05-31 NOTE — Telephone Encounter (Signed)
Pt states she was seen in the ER yesterday for pelvic pain and was told to follow up with [place] for a beta hcg test. Please advise.

## 2019-06-01 ENCOUNTER — Telehealth (INDEPENDENT_AMBULATORY_CARE_PROVIDER_SITE_OTHER): Payer: Medicaid Other | Admitting: Women's Health

## 2019-06-01 ENCOUNTER — Encounter: Payer: Self-pay | Admitting: Women's Health

## 2019-06-01 VITALS — Ht 63.0 in

## 2019-06-01 DIAGNOSIS — O3680X0 Pregnancy with inconclusive fetal viability, not applicable or unspecified: Secondary | ICD-10-CM | POA: Diagnosis not present

## 2019-06-01 LAB — BETA HCG QUANT (REF LAB): hCG Quant: 2821 m[IU]/mL

## 2019-06-01 NOTE — Progress Notes (Signed)
TELEHEALTH VIRTUAL GYN VISIT ENCOUNTER NOTE Patient name: Jasmine Pierce MRN [id number]  Date of birth: 07/07/2001  I connected with patient on 06/01/19 at 11:30 AM EDT by webex and verified that I am speaking with the correct person using two identifiers.  Due to COVID-19 recommendations, pt is not currently in the office.    I discussed the limitations, risks, security and privacy concerns of performing an evaluation and management service by telephone and the availability of in person appointments. I also discussed with the patient that there may be a patient responsible charge related to this service. The patient expressed understanding and agreed to proceed.   Chief Complaint:   discuss quant lab results  History of Present Illness:   Jasmine Pierce is a 18 y.o. [id number] African American female being evaluated today for probable failed early pregnancy. Went to [facility] 8/19 w/ pelvic pain, had hcg of 3,222 and vaginal u/s that showed tiny cystic structure w/in uterus, normal ovaries. Repeat hcg yesterday 2,821. Pain has resolved. Has not had any bleeding at all this pregnancy.   05/30/19 ED US:  Transvaginal ultrasound examination of the pelvis was performed including evaluation of the uterus, ovaries, adnexal regions, and pelvic cul-de-sac.  Color and duplex Doppler ultrasound was utilized to evaluate blood flow to the ovaries.  COMPARISON:  March 31, 2019  FINDINGS: Uterus  Measurements: 8.4 x 4 x 20 cm = volume: 99 mL. There is a small 3.8 x 2.7 x 2.7 cm cystic appearing structure that may be centered within the endometrial canal. There are no distinct fibroids.  Endometrium  Thickness: 8 mm.  Right ovary  Measurements: 2.4 x 1.9 x 3.1 cm = volume: 7 mL. Normal appearance/no adnexal mass.  Left ovary  Measurements: 2.6 x 2.6 x 3.7 cm = volume: 13 mL. Normal appearance/no adnexal mass.  Pulsed Doppler evaluation demonstrates normal low-resistance arterial and  venous waveforms in both ovaries.  IMPRESSION: 1. No definite acute sonographic abnormality. There is no evidence for ovarian torsion. 2. Tiny cystic structure in the uterus as detailed above. This could represent a very early intrauterine pregnancy in the appropriate clinical setting. In the setting of positive pregnancy test and no definite intrauterine pregnancy, this reflects a pregnancy of unknown location. Differential considerations include early normal IUP, abnormal IUP, or nonvisualized ectopic pregnancy. Differentiation is achieved with serial beta HCG supplemented by repeat sonography as clinically warranted.   Electronically Signed   By: Katherine Mantlehristopher  Green M.D.   On: 05/31/2019 15:52  Patient's last menstrual period was 04/27/2019. Review of Systems:   Pertinent items are noted in HPI Denies fever/chills, dizziness, headaches, visual disturbances, fatigue, shortness of breath, chest pain, abdominal pain, vomiting, abnormal vaginal discharge/itching/odor/irritation, problems with periods, bowel movements, urination, or intercourse unless otherwise stated above.  Pertinent History Reviewed:  Reviewed past medical,surgical, social, obstetrical and family history.  Reviewed problem list, medications and allergies. Physical Assessment:   Vitals:   06/01/19 1204  Height: 5\' 3"  (1.6 m)  Body mass index is 21.26 kg/m.       Physical Examination:   General:  Alert, oriented and cooperative.   Mental Status: Normal mood and affect perceived. Normal judgment and thought content.  Physical exam deferred due to nature of the encounter  No results found for this or any previous visit (from the past 24 hour(s)).  Assessment & Plan:  1) Probable failed early pregnancy of unknown location> will repeat bhcg on Monday, f/u in 1wk. Reviewed ectopic warning s/s, reasons  to seek care as well as what to expect w/ miscarriage and reasons to seek care.   Meds: No orders of the  defined types were placed in this encounter.   Orders Placed This Encounter  Procedures  . Beta hCG quant (ref lab)    I discussed the assessment and treatment plan with the patient. The patient was provided an opportunity to ask questions and all were answered. The patient agreed with the plan and demonstrated an understanding of the instructions.   The patient was advised to call back or seek an in-person evaluation/go to the ED if the symptoms worsen or if the condition fails to improve as anticipated.  I provided 10 minutes of non-face-to-face time during this encounter.   Return for [place] labs, Fri mychart gyn visit.  [place], [facility] 06/01/2019 12:52 PM

## 2019-06-04 DIAGNOSIS — O3680X0 Pregnancy with inconclusive fetal viability, not applicable or unspecified: Secondary | ICD-10-CM | POA: Diagnosis not present

## 2019-06-05 ENCOUNTER — Telehealth: Payer: Self-pay | Admitting: Women's Health

## 2019-06-05 DIAGNOSIS — Z3491 Encounter for supervision of normal pregnancy, unspecified, first trimester: Secondary | ICD-10-CM

## 2019-06-05 LAB — BETA HCG QUANT (REF LAB): hCG Quant: 15568 m[IU]/mL

## 2019-06-05 NOTE — Telephone Encounter (Signed)
Called pt, notified of [place] (up from 2,821 5d ago which had dropped from 3,222 the day prior so had thought was probable early failed pregnancy). +nausea, breast tenderness. Taking pnv daily. Will get u/s tomorrow 8/26 and go from there.  Roma Schanz, CNM, [facility] 06/05/2019 12:29 PM

## 2019-06-06 ENCOUNTER — Other Ambulatory Visit: Payer: Self-pay | Admitting: Women's Health

## 2019-06-06 ENCOUNTER — Other Ambulatory Visit: Payer: Self-pay

## 2019-06-06 ENCOUNTER — Ambulatory Visit (INDEPENDENT_AMBULATORY_CARE_PROVIDER_SITE_OTHER): Payer: Medicaid Other

## 2019-06-06 DIAGNOSIS — O30009 Twin pregnancy, unspecified number of placenta and unspecified number of amniotic sacs, unspecified trimester: Secondary | ICD-10-CM

## 2019-06-06 DIAGNOSIS — Z3491 Encounter for supervision of normal pregnancy, unspecified, first trimester: Secondary | ICD-10-CM

## 2019-06-06 DIAGNOSIS — O30001 Twin pregnancy, unspecified number of placenta and unspecified number of amniotic sacs, first trimester: Secondary | ICD-10-CM | POA: Diagnosis not present

## 2019-06-06 DIAGNOSIS — Z3689 Encounter for other specified antenatal screening: Secondary | ICD-10-CM

## 2019-06-06 DIAGNOSIS — Z3A01 Less than 8 weeks gestation of pregnancy: Secondary | ICD-10-CM | POA: Diagnosis not present

## 2019-06-06 DIAGNOSIS — O3680X0 Pregnancy with inconclusive fetal viability, not applicable or unspecified: Secondary | ICD-10-CM | POA: Diagnosis not present

## 2019-06-06 NOTE — Progress Notes (Signed)
[place] DI/DI TWINS:normal ovaries bilat BABY A: GS w/ys, 9.5 mm GS=5+5 wks,no fetal pole visualized BABY B: GS w/ys,10.2 mm GS=5+5 wks,no fetal pole visualized Patient will come back in 10 days for f/u ultrasound

## 2019-06-08 ENCOUNTER — Telehealth: Payer: Medicaid Other | Admitting: Advanced Practice Midwife

## 2019-06-12 ENCOUNTER — Telehealth: Payer: Medicaid Other | Admitting: Women's Health

## 2019-06-12 ENCOUNTER — Telehealth: Payer: Self-pay | Admitting: Women's Health

## 2019-06-12 ENCOUNTER — Encounter: Payer: Self-pay | Admitting: *Deleted

## 2019-06-12 ENCOUNTER — Other Ambulatory Visit: Payer: Self-pay | Admitting: Women's Health

## 2019-06-12 MED ORDER — DOXYLAMINE-PYRIDOXINE 10-10 MG PO TBEC: DELAYED_RELEASE_TABLET | ORAL | 6 refills | Status: DC

## 2019-06-12 NOTE — Telephone Encounter (Signed)
Pt would like to see if something for nausea can be sent in to [facility] in [place].

## 2019-06-19 ENCOUNTER — Other Ambulatory Visit: Payer: Self-pay | Admitting: Women's Health

## 2019-06-19 MED ORDER — PROMETHAZINE HCL 25 MG PO TABS: 12.5000 mg | ORAL_TABLET | Freq: Four times a day (QID) | ORAL | 0 refills | Status: DC | PRN

## 2019-06-20 ENCOUNTER — Other Ambulatory Visit: Payer: Self-pay | Admitting: Obstetrics & Gynecology

## 2019-06-20 ENCOUNTER — Other Ambulatory Visit: Payer: Self-pay | Admitting: Adult Health

## 2019-06-20 DIAGNOSIS — O3680X0 Pregnancy with inconclusive fetal viability, not applicable or unspecified: Secondary | ICD-10-CM

## 2019-06-20 DIAGNOSIS — O30001 Twin pregnancy, unspecified number of placenta and unspecified number of amniotic sacs, first trimester: Secondary | ICD-10-CM

## 2019-06-21 ENCOUNTER — Ambulatory Visit (INDEPENDENT_AMBULATORY_CARE_PROVIDER_SITE_OTHER): Payer: Medicaid Other

## 2019-06-21 ENCOUNTER — Other Ambulatory Visit: Payer: Self-pay

## 2019-06-21 DIAGNOSIS — Z3A08 8 weeks gestation of pregnancy: Secondary | ICD-10-CM | POA: Diagnosis not present

## 2019-06-21 DIAGNOSIS — O30001 Twin pregnancy, unspecified number of placenta and unspecified number of amniotic sacs, first trimester: Secondary | ICD-10-CM

## 2019-06-21 DIAGNOSIS — O3680X0 Pregnancy with inconclusive fetal viability, not applicable or unspecified: Secondary | ICD-10-CM

## 2019-06-21 NOTE — Progress Notes (Signed)
[place] DI/DI twins 7+6 wks,normal ovaries bilat BABY A:CRL 14.36 mm,fhr 155 bpm BABY B:CRL 14.07 mm,fhr 158 bpm

## 2019-07-26 ENCOUNTER — Other Ambulatory Visit: Payer: Self-pay | Admitting: Obstetrics and Gynecology

## 2019-07-26 DIAGNOSIS — Z3682 Encounter for antenatal screening for nuchal translucency: Secondary | ICD-10-CM

## 2019-07-26 DIAGNOSIS — O30001 Twin pregnancy, unspecified number of placenta and unspecified number of amniotic sacs, first trimester: Secondary | ICD-10-CM

## 2019-07-27 ENCOUNTER — Encounter: Payer: Self-pay | Admitting: Women's Health

## 2019-07-27 ENCOUNTER — Ambulatory Visit (INDEPENDENT_AMBULATORY_CARE_PROVIDER_SITE_OTHER): Payer: Medicaid Other

## 2019-07-27 ENCOUNTER — Ambulatory Visit: Payer: Medicaid Other | Admitting: *Deleted

## 2019-07-27 ENCOUNTER — Other Ambulatory Visit: Payer: Self-pay

## 2019-07-27 ENCOUNTER — Ambulatory Visit (INDEPENDENT_AMBULATORY_CARE_PROVIDER_SITE_OTHER): Payer: Medicaid Other | Admitting: Women's Health

## 2019-07-27 VITALS — BP 109/70 | HR 102 | Wt 117.0 lb

## 2019-07-27 DIAGNOSIS — Z3A13 13 weeks gestation of pregnancy: Secondary | ICD-10-CM

## 2019-07-27 DIAGNOSIS — O30041 Twin pregnancy, dichorionic/diamniotic, first trimester: Secondary | ICD-10-CM

## 2019-07-27 DIAGNOSIS — O30001 Twin pregnancy, unspecified number of placenta and unspecified number of amniotic sacs, first trimester: Secondary | ICD-10-CM | POA: Diagnosis not present

## 2019-07-27 DIAGNOSIS — Z23 Encounter for immunization: Secondary | ICD-10-CM

## 2019-07-27 DIAGNOSIS — Z363 Encounter for antenatal screening for malformations: Secondary | ICD-10-CM

## 2019-07-27 DIAGNOSIS — Z36 Encounter for antenatal screening for chromosomal anomalies: Secondary | ICD-10-CM

## 2019-07-27 DIAGNOSIS — O0990 Supervision of high risk pregnancy, unspecified, unspecified trimester: Secondary | ICD-10-CM

## 2019-07-27 DIAGNOSIS — O0991 Supervision of high risk pregnancy, unspecified, first trimester: Secondary | ICD-10-CM

## 2019-07-27 DIAGNOSIS — Z3682 Encounter for antenatal screening for nuchal translucency: Secondary | ICD-10-CM | POA: Diagnosis not present

## 2019-07-27 DIAGNOSIS — Z331 Pregnant state, incidental: Secondary | ICD-10-CM | POA: Diagnosis not present

## 2019-07-27 DIAGNOSIS — O30049 Twin pregnancy, dichorionic/diamniotic, unspecified trimester: Secondary | ICD-10-CM | POA: Insufficient documentation

## 2019-07-27 DIAGNOSIS — Z1379 Encounter for other screening for genetic and chromosomal anomalies: Secondary | ICD-10-CM | POA: Diagnosis not present

## 2019-07-27 DIAGNOSIS — Z1389 Encounter for screening for other disorder: Secondary | ICD-10-CM

## 2019-07-27 DIAGNOSIS — Z1371 Encounter for nonprocreative screening for genetic disease carrier status: Secondary | ICD-10-CM

## 2019-07-27 DIAGNOSIS — Z130 Encounter for screening for diseases of the blood and blood-forming organs and certain disorders involving the immune mechanism: Secondary | ICD-10-CM | POA: Diagnosis not present

## 2019-07-27 DIAGNOSIS — Z113 Encounter for screening for infections with a predominantly sexual mode of transmission: Secondary | ICD-10-CM

## 2019-07-27 MED ORDER — BLOOD PRESSURE MONITOR MISC: 0 refills | Status: DC

## 2019-07-27 NOTE — Progress Notes (Signed)
[place] DI/DI TWINS 13 wks,normal ovaries bilat BABY A :inferior,measurements c/w dates,crl 66.58 mm,anterior placenta,fhr 160 bpm,NB present NT 1.3 mm BABY B:superior,measurements c/w dates,crl 67.75 mm,anterior placenta,fhr 151 bpm,NB present,NT 1.3 mm

## 2019-07-27 NOTE — Patient Instructions (Signed)
Jasmine Pierce, I greatly value your feedback.  If you receive a survey following your visit with [place] today, we appreciate you taking the time to fill it out.  Thanks, Joellyn Haff, CNM, [facility]  The [facility] HAS MOVED!!! It is now [facility] at [facility] ([address] [place], [place] [address]) Entrance located off of [address] 24/7 valet parking   Begin taking a 81mg  baby aspirin daily to decrease risk of preeclampsia during pregnancy    Nausea & Vomiting  Have saltine crackers or pretzels by your bed and eat a few bites before you raise your head out of bed in the morning  Eat small frequent meals throughout the day instead of large meals  Drink plenty of fluids throughout the day to stay hydrated, just don't drink a lot of fluids with your meals.  This can make your stomach fill up faster making you feel sick  Do not brush your teeth right after you eat  Products with real ginger are good for nausea, like ginger ale and ginger hard candy Make sure it says made with real ginger!  Sucking on sour candy like lemon heads is also good for nausea  If your prenatal vitamins make you nauseated, take them at night so you will sleep through the nausea  Sea Bands  If you feel like you need medicine for the nausea & vomiting please let know  If you are unable to keep any fluids or food down please let us know   Constipation  Drink plenty of fluid, preferably water, throughout the day  Eat foods high in fiber such as fruits, vegetables, and grains  Exercise, such as walking, is a good way to keep your bowels regular  Drink warm fluids, especially warm prune juice, or decaf coffee  Eat a 1/2 cup of real oatmeal (not instant), 1/2 cup applesauce, and 1/2-1 cup warm prune juice every day  If needed, you may take Colace (docusate sodium) stool softener once or twice a day to help keep the stool soft.   If you still are having problems with  constipation, you may take Miralax once daily as needed to help keep your bowels regular.   Home Blood Pressure Monitoring for Patients   Your provider has recommended that you check your blood pressure (BP) at least once a week at home. If you do not have a blood pressure cuff at home, one will be provided for you. Contact your provider if you have not received your monitor within 1 week.   Helpful Tips for Accurate Home Blood Pressure Checks  . Don't smoke, exercise, or drink caffeine 30 minutes before checking your BP . Use the restroom before checking your BP (a full bladder can raise your pressure) . Relax in a comfortable upright chair . Feet on the ground . Left arm resting comfortably on a flat surface at the level of your heart . Legs uncrossed . Back supported . Sit quietly and don't talk . Place the cuff on your bare arm . Adjust snuggly, so that only two fingertips can fit between your skin and the top of the cuff . Check 2 readings separated by at least one minute . Keep a log of your BP readings . For a visual, please reference this diagram: http://ccnc.care/bpdiagram  Provider Name: [facility]     Phone: [phone]  Zone 1: ALL CLEAR  Continue to monitor your symptoms:  . BP reading is less  than 140 (top number) or less than 90 (bottom number)  . No right upper stomach pain . No headaches or seeing spots . No feeling nauseated or throwing up . No swelling in face and hands  Zone 2: CAUTION Call your doctor's office for any of the following:  . BP reading is greater than 140 (top number) or greater than 90 (bottom number)  . Stomach pain under your ribs in the middle or right side . Headaches or seeing spots . Feeling nauseated or throwing up . Swelling in face and hands  Zone 3: EMERGENCY  Seek immediate medical care if you have any of the following:  . BP reading is greater than160 (top number) or greater than 110 (bottom number) . Severe headaches  not improving with Tylenol . Serious difficulty catching your breath . Any worsening symptoms from Zone 2    First Trimester of Pregnancy The first trimester of pregnancy is from week 1 until the end of week 12 (months 1 through 3). A week after a sperm fertilizes an egg, the egg will implant on the wall of the uterus. This embryo will begin to develop into a baby. Genes from you and your partner are forming the baby. The female genes determine whether the baby is a boy or a girl. At 6-8 weeks, the eyes and face are formed, and the heartbeat can be seen on ultrasound. At the end of 12 weeks, all the baby's organs are formed.  Now that you are pregnant, you will want to do everything you can to have a healthy baby. Two of the most important things are to get good prenatal care and to follow your health care provider's instructions. Prenatal care is all the medical care you receive before the baby's birth. This care will help prevent, find, and treat any problems during the pregnancy and childbirth. BODY CHANGES Your body goes through many changes during pregnancy. The changes vary from woman to woman.   You may gain or lose a couple of pounds at first.  You may feel sick to your stomach (nauseous) and throw up (vomit). If the vomiting is uncontrollable, call your health care provider.  You may tire easily.  You may develop headaches that can be relieved by medicines approved by your health care provider.  You may urinate more often. Painful urination may mean you have a bladder infection.  You may develop heartburn as a result of your pregnancy.  You may develop constipation because certain hormones are causing the muscles that push waste through your intestines to slow down.  You may develop hemorrhoids or swollen, bulging veins (varicose veins).  Your breasts may begin to grow larger and become tender. Your nipples may stick out more, and the tissue that surrounds them (areola) may become  darker.  Your gums may bleed and may be sensitive to brushing and flossing.  Dark spots or blotches (chloasma, mask of pregnancy) may develop on your face. This will likely fade after the baby is born.  Your menstrual periods will stop.  You may have a loss of appetite.  You may develop cravings for certain kinds of food.  You may have changes in your emotions from day to day, such as being excited to be pregnant or being concerned that something may go wrong with the pregnancy and baby.  You may have more vivid and strange dreams.  You may have changes in your hair. These can include thickening of your hair, rapid growth, and  changes in texture. Some women also have hair loss during or after pregnancy, or hair that feels dry or thin. Your hair will most likely return to normal after your baby is born. WHAT TO EXPECT AT YOUR PRENATAL VISITS During a routine prenatal visit:  You will be weighed to make sure you and the baby are growing normally.  Your blood pressure will be taken.  Your abdomen will be measured to track your baby's growth.  The fetal heartbeat will be listened to starting around week 10 or 12 of your pregnancy.  Test results from any previous visits will be discussed. Your health care provider may ask you:  How you are feeling.  If you are feeling the baby move.  If you have had any abnormal symptoms, such as leaking fluid, bleeding, severe headaches, or abdominal cramping.  If you have any questions. Other tests that may be performed during your first trimester include:  Blood tests to find your blood type and to check for the presence of any previous infections. They will also be used to check for low iron levels (anemia) and Rh antibodies. Later in the pregnancy, blood tests for diabetes will be done along with other tests if problems develop.  Urine tests to check for infections, diabetes, or protein in the urine.  An ultrasound to confirm the proper  growth and development of the baby.  An amniocentesis to check for possible genetic problems.  Fetal screens for spina bifida and Down syndrome.  You may need other tests to make sure you and the baby are doing well. HOME CARE INSTRUCTIONS  Medicines  Follow your health care provider's instructions regarding medicine use. Specific medicines may be either safe or unsafe to take during pregnancy.  Take your prenatal vitamins as directed.  If you develop constipation, try taking a stool softener if your health care provider approves. Diet  Eat regular, well-balanced meals. Choose a variety of foods, such as meat or vegetable-based protein, fish, milk and low-fat dairy products, vegetables, fruits, and whole grain breads and cereals. Your health care provider will help you determine the amount of weight gain that is right for you.  Avoid raw meat and uncooked cheese. These carry germs that can cause birth defects in the baby.  Eating four or five small meals rather than three large meals a day may help relieve nausea and vomiting. If you start to feel nauseous, eating a few soda crackers can be helpful. Drinking liquids between meals instead of during meals also seems to help nausea and vomiting.  If you develop constipation, eat more high-fiber foods, such as fresh vegetables or fruit and whole grains. Drink enough fluids to keep your urine clear or pale yellow. Activity and Exercise  Exercise only as directed by your health care provider. Exercising will help you:  Control your weight.  Stay in shape.  Be prepared for labor and delivery.  Experiencing pain or cramping in the lower abdomen or low back is a good sign that you should stop exercising. Check with your health care provider before continuing normal exercises.  Try to avoid standing for long periods of time. Move your legs often if you must stand in one place for a long time.  Avoid heavy lifting.  Wear low-heeled  shoes, and practice good posture.  You may continue to have sex unless your health care provider directs you otherwise. Relief of Pain or Discomfort  Wear a good support bra for breast tenderness.  Take warm sitz baths to soothe any pain or discomfort caused by hemorrhoids. Use hemorrhoid cream if your health care provider approves.    Rest with your legs elevated if you have leg cramps or low back pain.  If you develop varicose veins in your legs, wear support hose. Elevate your feet for 15 minutes, 3-4 times a day. Limit salt in your diet. Prenatal Care  Schedule your prenatal visits by the twelfth week of pregnancy. They are usually scheduled monthly at first, then more often in the last 2 months before delivery.  Write down your questions. Take them to your prenatal visits.  Keep all your prenatal visits as directed by your health care provider. Safety  Wear your seat belt at all times when driving.  Make a list of emergency phone numbers, including numbers for family, friends, the hospital, and police and fire departments. General Tips  Ask your health care provider for a referral to a local prenatal education class. Begin classes no later than at the beginning of month 6 of your pregnancy.  Ask for help if you have counseling or nutritional needs during pregnancy. Your health care provider can offer advice or refer you to specialists for help with various needs.  Do not use hot tubs, steam rooms, or saunas.  Do not douche or use tampons or scented sanitary pads.  Do not cross your legs for long periods of time.  Avoid cat litter boxes and soil used by cats. These carry germs that can cause birth defects in the baby and possibly loss of the fetus by miscarriage or stillbirth.  Avoid all smoking, herbs, alcohol, and medicines not prescribed by your health care provider. Chemicals in these affect the formation and growth of the baby.  Schedule a dentist appointment. At  home, brush your teeth with a soft toothbrush and be gentle when you floss. SEEK MEDICAL CARE IF:   You have dizziness.  You have mild pelvic cramps, pelvic pressure, or nagging pain in the abdominal area.  You have persistent nausea, vomiting, or diarrhea.  You have a bad smelling vaginal discharge.  You have pain with urination.  You notice increased swelling in your face, hands, legs, or ankles. SEEK IMMEDIATE MEDICAL CARE IF:   You have a fever.  You are leaking fluid from your vagina.  You have spotting or bleeding from your vagina.  You have severe abdominal cramping or pain.  You have rapid weight gain or loss.  You vomit blood or material that looks like coffee grounds.  You are exposed to [place] measles and have never had them.  You are exposed to fifth disease or chickenpox.  You develop a severe headache.  You have shortness of breath.  You have any kind of trauma, such as from a fall or a car accident. Document Released: 09/21/2001 Document Revised: 02/11/2014 Document Reviewed: 08/07/2013 [facility] Patient Information 2015 [place], [place]. This information is not intended to replace advice given to you by your health care provider. Make sure you discuss any questions you have with your health care provider.  Coronavirus (COVID-19) Are you at risk?  Are you at risk for the Coronavirus (COVID-19)?  To be considered HIGH RISK for Coronavirus (COVID-19), you have to meet the following criteria:  . Traveled to [place], [place], [place], [place] or [place]; or in the [place] to [place], [place], [place], or [place]; and have fever, cough, and shortness of breath within the last 2 weeks of travel  OR . Been in close contact with a person diagnosed with COVID-19 within the last 2 weeks and have fever, cough, and shortness of breath . IF YOU DO NOT MEET THESE CRITERIA, YOU ARE CONSIDERED LOW RISK FOR COVID-19.  What to do if you are HIGH RISK for  COVID-19?  Marland Kitchen If you are having a medical emergency, call 911. . Seek medical care right away. Before you go to a doctor's office, urgent care or emergency department, call ahead and tell them about your recent travel, contact with someone diagnosed with COVID-19, and your symptoms. You should receive instructions from your physician's office regarding next steps of care.  . When you arrive at healthcare provider, tell the healthcare staff immediately you have returned from visiting [place], [place], [place], [place] or [place]; or traveled in the [place] to [place], [place], [place], or [place]; in the last two weeks or you have been in close contact with a person diagnosed with COVID-19 in the last 2 weeks.   . Tell the health care staff about your symptoms: fever, cough and shortness of breath. . After you have been seen by a medical provider, you will be either: o Tested for (COVID-19) and discharged home on quarantine except to seek medical care if symptoms worsen, and asked to  - Stay home and avoid contact with others until you get your results (4-5 days)  - Avoid travel on public transportation if possible (such as bus, train, or airplane) or o Sent to the Emergency Department by EMS for evaluation, COVID-19 testing, and possible admission depending on your condition and test results.  What to do if you are LOW RISK for COVID-19?  Reduce your risk of any infection by using the same precautions used for avoiding the common cold or flu:  Marland Kitchen Wash your hands often with soap and warm water for at least 20 seconds.  If soap and water are not readily available, use an alcohol-based hand sanitizer with at least 60% alcohol.  . If coughing or sneezing, cover your mouth and nose by coughing or sneezing into the elbow areas of your shirt or coat, into a tissue or into your sleeve (not your hands). . Avoid shaking hands with others and consider head nods or verbal greetings only. . Avoid  touching your eyes, nose, or mouth with unwashed hands.  . Avoid close contact with people who are sick. . Avoid places or events with large numbers of people in one location, like concerts or sporting events. . Carefully consider travel plans you have or are making. . If you are planning any travel outside or inside the [place], visit the CDC's Travelers' Health webpage for the latest health notices. . If you have some symptoms but not all symptoms, continue to monitor at home and seek medical attention if your symptoms worsen. . If you are having a medical emergency, call 911.   [place] / e-Visit: [email or web address]         [facility]: [facility]: [phone]                   [facility]: [phone]

## 2019-07-27 NOTE — Progress Notes (Signed)
INITIAL OBSTETRICAL VISIT Patient name: Jasmine Pierce MRN [id number]  Date of birth: 08/23/2001 Chief Complaint:   Initial Prenatal Visit (nt/it-twins)  History of Present Illness:   Jasmine Pierce is a 18 y.o. G75P0010 African American female at [redacted]w[redacted]d by LMP c/w 7wk u/s, with an Estimated Date of Delivery: 02/01/20 being seen today for her initial obstetrical visit.   Her obstetrical history is significant for SAB x 1.   Today she reports no complaints.  Patient's last menstrual period was 04/27/2019 (exact date). Last pap <19yo. Results were: n/a Review of Systems:   Pertinent items are noted in HPI Denies cramping/contractions, leakage of fluid, vaginal bleeding, abnormal vaginal discharge w/ itching/odor/irritation, headaches, visual changes, shortness of breath, chest pain, abdominal pain, severe nausea/vomiting, or problems with urination or bowel movements unless otherwise stated above.  Pertinent History Reviewed:  Reviewed past medical,surgical, social, obstetrical and family history.  Reviewed problem list, medications and allergies. OB History  Gravida Para Term Preterm AB Living  2 0 0 0 1 0  SAB TAB Ectopic Multiple Live Births  1 0 0 0 0    # Outcome Date GA Lbr Len/2nd Weight Sex Delivery Anes PTL Lv  2 Current           1 SAB            Physical Assessment:   Vitals:   07/27/19 1148  BP: 109/70  Pulse: (!) 102  Weight: 117 lb (53.1 kg)  Body mass index is 20.73 kg/m.       Physical Examination:  General appearance - well appearing, and in no distress  Mental status - alert, oriented to person, place, and time  Psych:  She has a normal mood and affect  Skin - warm and dry, normal color, no suspicious lesions noted  Chest - effort normal, all lung fields clear to auscultation bilaterally  Heart - normal rate and regular rhythm  Abdomen - soft, nontender  Extremities:  No swelling or varicosities noted  Thin prep pap is not done   TODAY'S NT [place] DI/DI TWINS  13 wks,normal ovaries bilat BABY A :inferior,measurements c/w dates,crl 66.58 mm,anterior placenta,fhr 160 bpm,NB present NT 1.3 mm BABY B:superior,measurements c/w dates,crl 67.75 mm,anterior placenta,fhr 151 bpm,NB present,NT 1.3 mm  No results found for this or any previous visit (from the past 24 hour(s)).  Assessment & Plan:  1) High-Risk Pregnancy G2P0010 at [redacted]w[redacted]d with an Estimated Date of Delivery: 02/01/20   2) Initial OB visit  3) Di-Di Twins> start ASA daily  Meds:  Meds ordered this encounter  Medications  . Blood Pressure Monitor MISC    Sig: For regular home bp monitoring during pregnancy    Dispense:  1 each    Refill:  0    O09.90    Initial labs obtained Continue prenatal vitamins Reviewed n/v relief measures and warning s/s to report Reviewed recommended weight gain based on pre-gravid BMI Encouraged well-balanced diet Genetic Screening discussed: requested nt/it, declined maternit21 Cystic fibrosis, SMA, Fragile X screening discussed requested Ultrasound discussed; fetal survey: requested CCNC completed>PCM not here, form faxed The nature of [place] for [employer] with multiple MDs and other Advanced Practice Providers was explained to patient; also emphasized that fellows, residents, and students are part of our team. Does not have home bp cuff. Rx faxed to [facility]. Check bp weekly, let us know if >140/90.  Flu shot today  Indications for ASA therapy (per uptodate) One of the following:  Previous pregnancy with preeclampsia, especially early onset and with an adverse outcome No Multifetal gestation Yes Chronic hypertension No Type 1 or 2 diabetes mellitus No Chronic kidney disease No Autoimmune disease (antiphospholipid syndrome, systemic lupus erythematosus) No   Indications for early 1 hour GTT (per uptodate) BMI >25 (>23 in Asian women)>NO  Follow-up: Return in about 5 weeks (around 08/31/2019) for HROB, 2nd IT, Twins, MB:TDHRCBU.    Orders Placed This Encounter  Procedures  . Urine Culture  . GC/Chlamydia Probe Amp  . US OB Comp + 14 Wk  . US OB Comp AddL Gest + 14 Wk  . Flu Vaccine QUAD 36+ mos IM  . Urinalysis, Routine w reflex microscopic  . Sickle cell screen  . Obstetric Panel, Including HIV  . Integrated 1  . Inheritest Core(CF97,SMA,FraX)  . POC Urinalysis Dipstick OB    Cheral Marker CNM, WHNP-BC 07/27/2019 1:00 PM

## 2019-07-28 LAB — OBSTETRIC PANEL, INCLUDING HIV
Antibody Screen: NEGATIVE
Basophils Absolute: 0 10*3/uL (ref 0.0–0.2)
Basos: 0 %
EOS (ABSOLUTE): 0.1 10*3/uL (ref 0.0–0.4)
Eos: 2 %
HIV Screen 4th Generation wRfx: NONREACTIVE
Hematocrit: 40.3 % (ref 34.0–46.6)
Hemoglobin: 13.4 g/dL (ref 11.1–15.9)
Hepatitis B Surface Ag: NEGATIVE
Immature Grans (Abs): 0 10*3/uL (ref 0.0–0.1)
Immature Granulocytes: 0 %
Lymphocytes Absolute: 1.9 10*3/uL (ref 0.7–3.1)
Lymphs: 23 %
MCH: 28.2 pg (ref 26.6–33.0)
MCHC: 33.3 g/dL (ref 31.5–35.7)
MCV: 85 fL (ref 79–97)
Monocytes Absolute: 0.3 10*3/uL (ref 0.1–0.9)
Monocytes: 4 %
Neutrophils Absolute: 5.9 10*3/uL (ref 1.4–7.0)
Neutrophils: 71 %
Platelets: 268 10*3/uL (ref 150–450)
RBC: 4.76 x10E6/uL (ref 3.77–5.28)
RDW: 13.4 % (ref 11.7–15.4)
RPR Ser Ql: NONREACTIVE
Rh Factor: POSITIVE
Rubella Antibodies, IGG: 3.49 index (ref >0.99)
WBC: 8.3 10*3/uL (ref 3.4–10.8)

## 2019-07-29 LAB — SICKLE CELL SCREEN: Sickle Cell Screen: NEGATIVE

## 2019-07-31 LAB — INTEGRATED 1
Crown Rump Length Twin B: 67 mm
Crown Rump Length: 66.6 mm
Gest. Age on Collection Date: 12.9 weeks
Maternal Age at EDD: 20.9 yr
NT Twin B: 1.3 mm
Nuchal Translucency (NT): 1.3 mm
Number of Fetuses: 2
PAPP-A Value: 4131.8 ng/mL
Weight: 117 [lb_av]

## 2019-08-01 DIAGNOSIS — O0990 Supervision of high risk pregnancy, unspecified, unspecified trimester: Secondary | ICD-10-CM | POA: Diagnosis not present

## 2019-08-07 LAB — INHERITEST CORE(CF97,SMA,FRAX)

## 2019-08-31 ENCOUNTER — Ambulatory Visit (INDEPENDENT_AMBULATORY_CARE_PROVIDER_SITE_OTHER): Payer: Medicaid Other | Admitting: Obstetrics & Gynecology

## 2019-08-31 ENCOUNTER — Ambulatory Visit (INDEPENDENT_AMBULATORY_CARE_PROVIDER_SITE_OTHER): Payer: Medicaid Other

## 2019-08-31 ENCOUNTER — Encounter: Payer: Self-pay | Admitting: Obstetrics & Gynecology

## 2019-08-31 ENCOUNTER — Other Ambulatory Visit: Payer: Self-pay

## 2019-08-31 VITALS — BP 105/70 | HR 67 | Wt 124.0 lb

## 2019-08-31 DIAGNOSIS — O30041 Twin pregnancy, dichorionic/diamniotic, first trimester: Secondary | ICD-10-CM

## 2019-08-31 DIAGNOSIS — Z363 Encounter for antenatal screening for malformations: Secondary | ICD-10-CM | POA: Diagnosis not present

## 2019-08-31 DIAGNOSIS — Z3A18 18 weeks gestation of pregnancy: Secondary | ICD-10-CM

## 2019-08-31 DIAGNOSIS — Z331 Pregnant state, incidental: Secondary | ICD-10-CM

## 2019-08-31 DIAGNOSIS — O0992 Supervision of high risk pregnancy, unspecified, second trimester: Secondary | ICD-10-CM

## 2019-08-31 DIAGNOSIS — Z362 Encounter for other antenatal screening follow-up: Secondary | ICD-10-CM | POA: Diagnosis not present

## 2019-08-31 DIAGNOSIS — O0991 Supervision of high risk pregnancy, unspecified, first trimester: Secondary | ICD-10-CM

## 2019-08-31 DIAGNOSIS — Z1379 Encounter for other screening for genetic and chromosomal anomalies: Secondary | ICD-10-CM

## 2019-08-31 DIAGNOSIS — O0990 Supervision of high risk pregnancy, unspecified, unspecified trimester: Secondary | ICD-10-CM

## 2019-08-31 DIAGNOSIS — Z1389 Encounter for screening for other disorder: Secondary | ICD-10-CM

## 2019-08-31 DIAGNOSIS — O30042 Twin pregnancy, dichorionic/diamniotic, second trimester: Secondary | ICD-10-CM

## 2019-08-31 NOTE — Progress Notes (Signed)
   HIGH-RISK PREGNANCY VISIT Patient name: Jasmine Pierce MRN [id number]  Date of birth: November 05, 2000 Chief Complaint:   Routine Prenatal Visit (2nd IT/US)  History of Present Illness:   Jasmine Pierce is a 18 y.o. G40P0010 female at [redacted]w[redacted]d with an Estimated Date of Delivery: 02/01/20 being seen today for ongoing management of a high-risk pregnancy complicated by DiDi twins.  Today she reports no complaints. Contractions: Not present. Vag. Bleeding: None.  Movement: Present. denies leaking of fluid.  Review of Systems:   Pertinent items are noted in HPI Denies abnormal vaginal discharge w/ itching/odor/irritation, headaches, visual changes, shortness of breath, chest pain, abdominal pain, severe nausea/vomiting, or problems with urination or bowel movements unless otherwise stated above. Pertinent History Reviewed:  Reviewed past medical,surgical, social, obstetrical and family history.  Reviewed problem list, medications and allergies. Physical Assessment:   Vitals:   08/31/19 0938  BP: 105/70  Pulse: 67  Weight: 124 lb (56.2 kg)  Body mass index is 21.97 kg/m.           Physical Examination:   General appearance: alert, well appearing, and in no distress  Mental status: alert, oriented to person, place, and time  Skin: warm & dry   Extremities: Edema: None    Cardiovascular: normal heart rate noted  Respiratory: normal respiratory effort, no distress  Abdomen: gravid, soft, non-tender  Pelvic: Cervical exam deferred         Fetal Status: Fetal Heart Rate (bpm): sono   Movement: Present    Fetal Surveillance Testing today:    Chaperone: n/a    No results found for this or any previous visit (from the past 24 hour(s)).  Assessment & Plan:  1) High-risk pregnancy G2P0010 at [redacted]w[redacted]d with an Estimated Date of Delivery: 02/01/20   2) DiDi twins, stable    Meds: No orders of the defined types were placed in this encounter.   Labs/procedures today: 2nd IT  Treatment Plan:  4 weeks  in office  ReviewedPre Term labor symptoms and general obstetric precautions including but not limited to vaginal bleeding, contractions, leaking of fluid and fetal movement were reviewed in detail with the patient.  All questions were answered.  home bp cuff. Rx faxed to . Check bp weekly, let us know if >140/90.   Follow-up: Return in about 4 weeks (around 09/28/2019) for [place].  Orders Placed This Encounter  Procedures  . INTEGRATED 2  . Pain Management Screening Profile (10S)  . POC Urinalysis Dipstick OB   Florian Buff  08/31/2019 10:19 AM

## 2019-08-31 NOTE — Progress Notes (Signed)
[place] DI/DI TWINS:normal ovaries bilat,cx 2.9 cm BABY A:inferior cephalic,anterior placenta gr 0,svp of fluid 3.5 cm,fhr 171 bpm,efw 220 g 46% BABY B:superior transverse head left,anterior placenta gr 0,svp of fluid 4.5 cm,LVEICF 2.1 mm,fhr 150 bpm,efw 217 g 42% Anatomy complete,no obvious abnormalities

## 2019-09-03 LAB — INTEGRATED 2
AFP MoM: 1.87
Alpha-Fetoprotein: 95.3 ng/mL
Crown Rump Length Twin B: 67 mm
Crown Rump Length: 66.6 mm
DIA MoM: 1.59
DIA Value: 304.5 pg/mL
Estriol, Unconjugated: 3.09 ng/mL
Gest. Age on Collection Date: 12.9 weeks
Gestational Age: 17.9 weeks
Maternal Age at EDD: 20.9 yr
NT MoM Twin B: 0.86
NT Twin B: 1.3 mm
Nuchal Translucency (NT): 1.3 mm
Nuchal Translucency MoM: 0.87
Number of Fetuses: 2
PAPP-A MoM: 2.94
PAPP-A Value: 4131.8 ng/mL
Weight: 117 [lb_av]
Weight: 117 [lb_av]
hCG MoM: 4.46
hCG Value: 130.3 IU/mL
uE3 MoM: 2.08

## 2019-09-11 ENCOUNTER — Other Ambulatory Visit: Payer: Self-pay

## 2019-09-11 DIAGNOSIS — Z20822 Contact with and (suspected) exposure to covid-19: Secondary | ICD-10-CM

## 2019-09-13 LAB — NOVEL CORONAVIRUS, NAA: SARS-CoV-2, NAA: NOT DETECTED

## 2019-09-28 ENCOUNTER — Encounter: Payer: Self-pay | Admitting: Obstetrics & Gynecology

## 2019-09-28 ENCOUNTER — Other Ambulatory Visit: Payer: Self-pay

## 2019-09-28 ENCOUNTER — Ambulatory Visit (INDEPENDENT_AMBULATORY_CARE_PROVIDER_SITE_OTHER): Payer: Medicaid Other | Admitting: Obstetrics & Gynecology

## 2019-09-28 VITALS — BP 118/69 | HR 64 | Wt 129.0 lb

## 2019-09-28 DIAGNOSIS — O0990 Supervision of high risk pregnancy, unspecified, unspecified trimester: Secondary | ICD-10-CM

## 2019-09-28 DIAGNOSIS — Z331 Pregnant state, incidental: Secondary | ICD-10-CM | POA: Diagnosis not present

## 2019-09-28 DIAGNOSIS — Z1389 Encounter for screening for other disorder: Secondary | ICD-10-CM | POA: Diagnosis not present

## 2019-09-28 DIAGNOSIS — O30041 Twin pregnancy, dichorionic/diamniotic, first trimester: Secondary | ICD-10-CM

## 2019-09-28 DIAGNOSIS — O0992 Supervision of high risk pregnancy, unspecified, second trimester: Secondary | ICD-10-CM

## 2019-09-28 DIAGNOSIS — Z322 Encounter for childbirth instruction: Secondary | ICD-10-CM

## 2019-09-28 DIAGNOSIS — O30042 Twin pregnancy, dichorionic/diamniotic, second trimester: Secondary | ICD-10-CM

## 2019-09-28 NOTE — Progress Notes (Signed)
   HIGH-RISK PREGNANCY VISIT Patient name: Jasmine Pierce MRN [id number]  Date of birth: 23-Mar-2001 Chief Complaint:   Routine Prenatal Visit  History of Present Illness:   Jasmine Pierce is a 18 y.o. G62P0010 female at [redacted]w[redacted]d with an Estimated Date of Delivery: 02/01/20 being seen today for ongoing management of a high-risk pregnancy complicated by DiDi twins.  Today she reports no complaints. Contractions: Not present. Vag. Bleeding: None.  Movement: Present. denies leaking of fluid.  Review of Systems:   Pertinent items are noted in HPI Denies abnormal vaginal discharge w/ itching/odor/irritation, headaches, visual changes, shortness of breath, chest pain, abdominal pain, severe nausea/vomiting, or problems with urination or bowel movements unless otherwise stated above. Pertinent History Reviewed:  Reviewed past medical,surgical, social, obstetrical and family history.  Reviewed problem list, medications and allergies. Physical Assessment:   Vitals:   09/28/19 0842  BP: 118/69  Pulse: 64  Weight: 129 lb (58.5 kg)  Body mass index is 22.85 kg/m.           Physical Examination:   General appearance: alert, well appearing, and in no distress  Mental status: alert, oriented to person, place, and time  Skin: warm & dry   Extremities: Edema: None    Cardiovascular: normal heart rate noted  Respiratory: normal respiratory effort, no distress  Abdomen: gravid, soft, non-tender  Pelvic: Cervical exam deferred         Fetal Status: Fetal Heart Rate (bpm): 145/135 Fundal Height: 29 cm Movement: Present    Fetal Surveillance Testing today: FHT x 2   Chaperone: n/a    No results found for this or any previous visit (from the past 24 hour(s)).  Assessment & Plan:  1) High-risk pregnancy G2P0010 at [redacted]w[redacted]d with an Estimated Date of Delivery: 02/01/20   2) DiDi twins, stable, EFW at 28 weeks, along with PN2    Meds: No orders of the defined types were placed in this  encounter.   Labs/procedures today:   Treatment Plan:  EFW sonogram 28 weeks  Reviewed: Preterm labor symptoms and general obstetric precautions including but not limited to vaginal bleeding, contractions, leaking of fluid and fetal movement were reviewed in detail with the patient.  All questions were answered.  home bp cuff. Rx faxed to . Check bp weekly, let us know if >140/90.   Follow-up: Return in about 3 weeks (around 10/19/2019) for [place], with Dr Elonda Husky.  Orders Placed This Encounter  Procedures  . GC/Chlamydia Probe Amp  . Urine Culture  . Pain Management Screening Profile (10S)  . Urinalysis, Routine w reflex microscopic   Florian Buff  09/28/2019 9:12 AM

## 2019-10-01 ENCOUNTER — Other Ambulatory Visit: Payer: Self-pay

## 2019-10-01 ENCOUNTER — Inpatient Hospital Stay (HOSPITAL_COMMUNITY)
Admission: AD | Admit: 2019-10-01 | Discharge: 2019-10-01 | Disposition: A | Discharge status: Home / Self Care | Payer: Medicaid Other | Attending: Obstetrics & Gynecology | Admitting: Obstetrics & Gynecology

## 2019-10-01 ENCOUNTER — Inpatient Hospital Stay (HOSPITAL_BASED_OUTPATIENT_CLINIC_OR_DEPARTMENT_OTHER): Payer: Medicaid Other

## 2019-10-01 ENCOUNTER — Encounter (HOSPITAL_COMMUNITY): Payer: Self-pay | Admitting: Obstetrics & Gynecology

## 2019-10-01 DIAGNOSIS — O99512 Diseases of the respiratory system complicating pregnancy, second trimester: Secondary | ICD-10-CM | POA: Insufficient documentation

## 2019-10-01 DIAGNOSIS — O30042 Twin pregnancy, dichorionic/diamniotic, second trimester: Secondary | ICD-10-CM | POA: Diagnosis not present

## 2019-10-01 DIAGNOSIS — Z833 Family history of diabetes mellitus: Secondary | ICD-10-CM | POA: Insufficient documentation

## 2019-10-01 DIAGNOSIS — Z79899 Other long term (current) drug therapy: Secondary | ICD-10-CM | POA: Diagnosis not present

## 2019-10-01 DIAGNOSIS — O26892 Other specified pregnancy related conditions, second trimester: Secondary | ICD-10-CM

## 2019-10-01 DIAGNOSIS — J45909 Unspecified asthma, uncomplicated: Secondary | ICD-10-CM | POA: Insufficient documentation

## 2019-10-01 DIAGNOSIS — O99891 Other specified diseases and conditions complicating pregnancy: Secondary | ICD-10-CM | POA: Insufficient documentation

## 2019-10-01 DIAGNOSIS — Z800 Family history of malignant neoplasm of digestive organs: Secondary | ICD-10-CM | POA: Diagnosis not present

## 2019-10-01 DIAGNOSIS — M545 Low back pain: Secondary | ICD-10-CM | POA: Diagnosis not present

## 2019-10-01 DIAGNOSIS — Z87891 Personal history of nicotine dependence: Secondary | ICD-10-CM | POA: Insufficient documentation

## 2019-10-01 DIAGNOSIS — Z7982 Long term (current) use of aspirin: Secondary | ICD-10-CM | POA: Diagnosis not present

## 2019-10-01 DIAGNOSIS — Z3A22 22 weeks gestation of pregnancy: Secondary | ICD-10-CM

## 2019-10-01 DIAGNOSIS — Z8541 Personal history of malignant neoplasm of cervix uteri: Secondary | ICD-10-CM | POA: Insufficient documentation

## 2019-10-01 DIAGNOSIS — R109 Unspecified abdominal pain: Secondary | ICD-10-CM | POA: Diagnosis not present

## 2019-10-01 DIAGNOSIS — O30049 Twin pregnancy, dichorionic/diamniotic, unspecified trimester: Secondary | ICD-10-CM

## 2019-10-01 LAB — URINALYSIS, ROUTINE W REFLEX MICROSCOPIC
Bacteria, UA: NONE SEEN
Bilirubin Urine: NEGATIVE
Glucose, UA: NEGATIVE mg/dL
Hgb urine dipstick: NEGATIVE
Ketones, ur: NEGATIVE mg/dL
Leukocytes,Ua: NEGATIVE
Nitrite: NEGATIVE
Protein, ur: 30 mg/dL — AB
Specific Gravity, Urine: 1.029 (ref 1.005–1.030)
pH: 6 (ref 5.0–8.0)

## 2019-10-01 NOTE — Discharge Instructions (Signed)
Warning Signs During Pregnancy °A pregnancy lasts about 40 weeks, starting from the first day of your last period until the baby is born. Pregnancy is divided into three phases called trimesters. °· The first trimester refers to week 1 through week 13 of pregnancy. °· The second trimester is the start of week 14 through the end of week 27. °· The third trimester is the start of week 28 until you deliver your baby. °During each trimester of pregnancy, certain signs and symptoms may indicate a problem. Talk with your health care provider about your current health and any medical conditions you have. Make sure you know the symptoms that you should watch for and report. °How does this affect me? ° °Warning signs in the first trimester °While some changes during the first trimester may be uncomfortable, most do not represent a serious problem. Let your health care provider know if you have any of the following warning signs in the first trimester: °· You cannot eat or drink without vomiting, and this lasts for longer than a day. °· You have vaginal bleeding or spotting along with menstrual-like cramping. °· You have diarrhea for longer than a day. °· You have a fever or other signs of infection, such as: °? Pain or burning when you urinate. °? Foul smelling or thick or yellowish vaginal discharge. °Warning signs in the second trimester °As your baby grows and changes during the second trimester, there are additional signs and symptoms that may indicate a problem. These include: °· Signs and symptoms of infection, including a fever. °· Signs or symptoms of a miscarriage or preterm labor, such as regular contractions, menstrual-like cramping, or lower abdominal pain. °· Bloody or watery vaginal discharge or obvious vaginal bleeding. °· Feeling like your heart is pounding. °· Having trouble breathing. °· Nausea, vomiting, or diarrhea that lasts for longer than a day. °· Craving non-food items, such as clay, chalk, or dirt.  This may be a sign of a very treatable medical condition called pica. °Later in your second trimester, watch for signs and symptoms of a serious medical condition called preeclampsia.These include: °· Changes in your vision. °· A severe headache that does not go away. °· Nausea and vomiting. °It is also important to notice if your baby stops moving or moves less than usual during this time. °Warning signs in the third trimester °As you approach the third trimester, your baby is growing and your body is preparing for the birth of your baby. In your third trimester, be sure to let your health care provider know if: °· You have signs and symptoms of infection, including a fever. °· You have vaginal bleeding. °· You notice that your baby is moving less than usual or is not moving. °· You have nausea, vomiting, or diarrhea that lasts for longer than a day. °· You have a severe headache that does not go away. °· You have vision changes, including seeing spots or having blurry or double vision. °· You have increased swelling in your hands or face. °How does this affect my baby? °Throughout your pregnancy, always report any of the warning signs of a problem to your health care provider. This can help prevent complications that may affect your baby, including: °· Increased risk for premature birth. °· Infection that may be transmitted to your baby. °· Increased risk for stillbirth. °Contact a health care provider if: °· You have any of the warning signs of a problem for the current trimester of your pregnancy. °·   Any of the following apply to you during any trimester of pregnancy: °? You have strong emotions, such as sadness or anxiety, that interfere with work or personal relationships. °? You feel unsafe in your home and need help finding a safe place to live. °? You are using tobacco products, alcohol, or drugs and you need help to stop. °Get help right away if: °You have signs or symptoms of labor before 37 weeks of  pregnancy. These include: °· Contractions that are 5 minutes or less apart, or that increase in frequency, intensity, or length. °· Sudden, sharp abdominal pain or low back pain. °· Uncontrolled gush or trickle of fluid from your vagina. °Summary °· A pregnancy lasts about 40 weeks, starting from the first day of your last period until the baby is born. Pregnancy is divided into three phases called trimesters. Each trimester has warning signs to watch for. °· Always report any warning signs to your health care provider in order to prevent complications that may affect both you and your baby. °· Talk with your health care provider about your current health and any medical conditions you have. Make sure you know the symptoms that you should watch for and report. °This information is not intended to replace advice given to you by your health care provider. Make sure you discuss any questions you have with your health care provider. °Document Released: 07/14/2017 Document Revised: 01/16/2019 Document Reviewed: 07/14/2017 °Elsevier Patient Education © 2020 [employer] ° °

## 2019-10-01 NOTE — MAU Provider Note (Signed)
Chief Complaint: Abdominal Pain and Back Pain   First Provider Initiated Contact with Patient 10/01/19 1802     SUBJECTIVE HPI: Jasmine Pierce is a 18 y.o. G2P0010 at 7658w3d with di/di twins who presents to Maternity Admissions reporting abdominal cramping & low back pain. Symptoms started this morning. Reports constant low back pain & intermittent abdominal cramping. The abdominal cramping occurs several times per hour but can't tell how frequently. Denies n/v/d, dysuria, vaginal bleeding, LOF, or recent intercourse. Good fetal movement. X 2.   Location: abdomen Quality: cramping Severity: 4/10 on pain scale Duration: <1 day Timing: intermittent Modifying factors: none Associated signs and symptoms: none  Past Medical History:  Diagnosis Date  . Asthma   . Miscarriage   . Ovarian cyst    ruptured in 2016   OB History  Gravida Para Term Preterm AB Living  2 0 0 0 1 0  SAB TAB Ectopic Multiple Live Births  1 0 0 0 0    # Outcome Date GA Lbr Len/2nd Weight Sex Delivery Anes PTL Lv  2 Current           1 SAB            Past Surgical History:  Procedure Laterality Date  . NO PAST SURGERIES     Social History   Socioeconomic History  . Marital status: Single    Spouse name: Not on file  . Number of children: Not on file  . Years of education: Not on file  . Highest education level: High school graduate  Occupational History  . Occupation: [employer] and [employer]  Tobacco Use  . Smoking status: Former Smoker    Types: Cigarettes  . Smokeless tobacco: Never Used  . Tobacco comment: Quit "months ago"   Substance and Sexual Activity  . Alcohol use: No  . Drug use: No  . Sexual activity: Yes    Birth control/protection: None  Other Topics Concern  . Not on file  Social History Narrative  . Not on file   Social Determinants of Health   Financial Resource Strain: Low Risk   . Difficulty of Paying Living Expenses: Not hard at all  Food Insecurity: No Food Insecurity   . Worried About Programme researcher, broadcasting/film/videounning Out of Food in the Last Year: Never true  . Ran Out of Food in the Last Year: Never true  Transportation Needs: No Transportation Needs  . Lack of Transportation (Medical): No  . Lack of Transportation (Non-Medical): No  Physical Activity: Inactive  . Days of Exercise per Week: 0 days  . Minutes of Exercise per Session: 0 min  Stress: No Stress Concern Present  . Feeling of Stress : Not at all  Social Connections: Somewhat Isolated  . Frequency of Communication with Friends and Family: More than three times a week  . Frequency of Social Gatherings with Friends and Family: Once a week  . Attends Religious Services: Never  . Active Member of Clubs or Organizations: No  . Attends BankerClub or Organization Meetings: Never  . Marital Status: Living with partner  Intimate Partner Violence: Not At Risk  . Fear of Current or Ex-Partner: No  . Emotionally Abused: No  . Physically Abused: No  . Sexually Abused: No   Family History  Problem Relation Age of Onset  . Hypertension Paternal Grandfather   . Diabetes Paternal Grandfather   . Diabetes Paternal Grandmother   . Hypertension Paternal Grandmother   . Hypertension Maternal Grandmother   . Graves'  disease Father   . Sickle cell trait Mother   . Chronic Renal Failure Other   . Cancer Other        cervical  . Colon cancer Other   . Asthma Maternal Aunt    No current facility-administered medications on file prior to encounter.   Current Outpatient Medications on File Prior to Encounter  Medication Sig Dispense Refill  . aspirin EC 81 MG tablet Take 81 mg by mouth daily.    Marland Kitchen FOLIC ACID PO Take by mouth daily.    . prenatal vitamin w/FE, FA (PRENATAL 1 + 1) 27-1 MG TABS tablet Take 1 tablet by mouth daily at 12 noon. 30 tablet 12  . acetaminophen (TYLENOL) 500 MG tablet Take 500 mg by mouth daily as needed for mild pain or moderate pain.    . Blood Pressure Monitor MISC For regular home bp monitoring during  pregnancy (Patient not taking: Reported on 08/31/2019) 1 each 0  . Doxylamine-Pyridoxine (DICLEGIS) 10-10 MG TBEC 2 tabs q hs, if sx persist add 1 tab q am on day 3, if sx persist add 1 tab q afternoon on day 4 (Patient not taking: Reported on 07/27/2019) 100 tablet 6  . promethazine (PHENERGAN) 25 MG tablet Take 0.5-1 tablets (12.5-25 mg total) by mouth every 6 (six) hours as needed for nausea or vomiting. (Patient not taking: Reported on 07/27/2019) 30 tablet 0   No Known Allergies  I have reviewed patient's Past Medical Hx, Surgical Hx, Family Hx, Social Hx, medications and allergies.   Review of Systems  Constitutional: Negative.   Gastrointestinal: Positive for abdominal pain.  Genitourinary: Negative.   Musculoskeletal: Positive for back pain.    OBJECTIVE Patient Vitals for the past 24 hrs:  BP Temp Temp src Pulse Resp SpO2 Height Weight  10/01/19 1738 113/64 98.3 F (36.8 C) Oral 93 18 100 % 5\' 3"  (1.6 m) 59.2 kg   Constitutional: Well-developed, well-nourished female in no acute distress.  Cardiovascular: normal rate & rhythm, no murmur Respiratory: normal rate and effort. Lung sounds clear throughout GI: Abd soft, non-tender, Pos BS x 4. No guarding or rebound tenderness MS: Extremities nontender, no edema, normal ROM Neurologic: Alert and oriented x 4.  GU:  Cervix closed/firm LAB RESULTS Results for orders placed or performed during the hospital encounter of 10/01/19 (from the past 24 hour(s))  Urinalysis, Routine w reflex microscopic     Status: Abnormal   Collection Time: 10/01/19  6:15 PM  Result Value Ref Range   Color, Urine YELLOW YELLOW   APPearance CLOUDY (A) CLEAR   Specific Gravity, Urine 1.029 1.005 - 1.030   pH 6.0 5.0 - 8.0   Glucose, UA NEGATIVE NEGATIVE mg/dL   Hgb urine dipstick NEGATIVE NEGATIVE   Bilirubin Urine NEGATIVE NEGATIVE   Ketones, ur NEGATIVE NEGATIVE mg/dL   Protein, ur 30 (A) NEGATIVE mg/dL   Nitrite NEGATIVE NEGATIVE    Leukocytes,Ua NEGATIVE NEGATIVE   RBC / HPF 0-5 0 - 5 RBC/hpf   WBC, UA 0-5 0 - 5 WBC/hpf   Bacteria, UA NONE SEEN NONE SEEN   Squamous Epithelial / LPF 0-5 0 - 5   Mucus PRESENT     IMAGING No results found.  MAU COURSE Orders Placed This Encounter  Procedures  . [place] MFM OB LIMITED  . Urinalysis, Routine w reflex microscopic  . Discharge patient   No orders of the defined types were placed in this encounter.   MDM RN unable to doppler FHTs  Pt informed that the ultrasound is considered a limited OB ultrasound and is not intended to be a complete ultrasound exam.  Patient also informed that the ultrasound is not being completed with the intent of assessing for fetal or placental anomalies or any pelvic abnormalities.  Explained that the purpose of today's ultrasound is to assess for  viability.  Patient acknowledges the purpose of the exam and the limitations of the study.  LIVE IUP x 2. FHR 143 & 150 bpm.   Cervix closed TVUS, cervical length 3.9 cm  Pt declined medication for pain & doesn't appear uncomfortable.  U/a negative for signs of infection  ASSESSMENT 1. Abdominal pain during pregnancy in second trimester   2. Twin pregnancy, twins dichorionic and diamniotic   3. [redacted] weeks gestation of pregnancy     PLAN Discharge home in stable condition. Preterm labor precautions  Follow-up Information    [facility] Follow up.   Specialty: Obstetrics and Gynecology Why: return for worsening symptoms Contact information: [address] [id number] Wilhemina Bonito [place] [address] [phone]         Allergies as of 10/01/2019   No Known Allergies     Medication List    STOP taking these medications   Doxylamine-Pyridoxine 10-10 MG Tbec Commonly known as: Diclegis   promethazine 25 MG tablet Commonly known as: PHENERGAN     TAKE these medications   aspirin EC 81 MG tablet Take 81 mg by mouth daily.   Blood Pressure  Monitor Misc For regular home bp monitoring during pregnancy   FOLIC ACID PO Take by mouth daily.   prenatal vitamin w/FE, FA 27-1 MG Tabs tablet Take 1 tablet by mouth daily at 12 noon.   TYLENOL 500 MG tablet Generic drug: acetaminophen Take 500 mg by mouth daily as needed for mild pain or moderate pain.        Judeth Horn, NP 10/01/2019  7:43 PM

## 2019-10-01 NOTE — MAU Note (Signed)
Was told by her OBGYN to come in because she thought she might be having preterm ctx's.  Pt reports constant low back ache and period like cramps. No bleeding or water leaking.

## 2019-10-06 LAB — URINALYSIS, ROUTINE W REFLEX MICROSCOPIC
Bilirubin, UA: NEGATIVE
Glucose, UA: NEGATIVE
Ketones, UA: NEGATIVE
Leukocytes,UA: NEGATIVE
Nitrite, UA: NEGATIVE
Protein,UA: NEGATIVE
RBC, UA: NEGATIVE
Specific Gravity, UA: 1.029 (ref 1.005–1.030)
Urobilinogen, Ur: 0.2 mg/dL (ref 0.2–1.0)
pH, UA: 6 (ref 5.0–7.5)

## 2019-10-06 LAB — PMP SCREEN PROFILE (10S), URINE
Amphetamine Scrn, Ur: NEGATIVE ng/mL
BARBITURATE SCREEN URINE: NEGATIVE ng/mL
BENZODIAZEPINE SCREEN, URINE: NEGATIVE ng/mL
CANNABINOIDS UR QL SCN: NEGATIVE ng/mL
Cocaine (Metab) Scrn, Ur: NEGATIVE ng/mL
Creatinine(Crt), U: 236.5 mg/dL (ref 20.0–300.0)
Methadone Screen, Urine: NEGATIVE ng/mL
OXYCODONE+OXYMORPHONE UR QL SCN: NEGATIVE ng/mL
Opiate Scrn, Ur: NEGATIVE ng/mL
Ph of Urine: 6.1 (ref 4.5–8.9)
Phencyclidine Qn, Ur: NEGATIVE ng/mL
Propoxyphene Scrn, Ur: NEGATIVE ng/mL

## 2019-10-06 LAB — URINE CULTURE

## 2019-10-06 LAB — GC/CHLAMYDIA PROBE AMP

## 2019-10-06 LAB — SPECIMEN STATUS REPORT

## 2019-10-09 ENCOUNTER — Encounter: Payer: Self-pay | Admitting: *Deleted

## 2019-10-11 ENCOUNTER — Encounter: Payer: Self-pay | Admitting: *Deleted

## 2019-10-19 ENCOUNTER — Encounter: Payer: Self-pay | Admitting: Obstetrics & Gynecology

## 2019-10-19 ENCOUNTER — Ambulatory Visit (INDEPENDENT_AMBULATORY_CARE_PROVIDER_SITE_OTHER): Payer: Medicaid Other | Admitting: Obstetrics & Gynecology

## 2019-10-19 ENCOUNTER — Other Ambulatory Visit: Payer: Self-pay

## 2019-10-19 VITALS — BP 113/69 | HR 84 | Wt 136.0 lb

## 2019-10-19 DIAGNOSIS — Z1389 Encounter for screening for other disorder: Secondary | ICD-10-CM

## 2019-10-19 DIAGNOSIS — O30041 Twin pregnancy, dichorionic/diamniotic, first trimester: Secondary | ICD-10-CM

## 2019-10-19 DIAGNOSIS — O0992 Supervision of high risk pregnancy, unspecified, second trimester: Secondary | ICD-10-CM

## 2019-10-19 DIAGNOSIS — Z3A25 25 weeks gestation of pregnancy: Secondary | ICD-10-CM

## 2019-10-19 DIAGNOSIS — O0990 Supervision of high risk pregnancy, unspecified, unspecified trimester: Secondary | ICD-10-CM

## 2019-10-19 DIAGNOSIS — O30042 Twin pregnancy, dichorionic/diamniotic, second trimester: Secondary | ICD-10-CM

## 2019-10-19 DIAGNOSIS — Z331 Pregnant state, incidental: Secondary | ICD-10-CM

## 2019-10-19 LAB — POCT URINALYSIS DIPSTICK OB
Blood, UA: NEGATIVE
Glucose, UA: NEGATIVE
Ketones, UA: NEGATIVE
Nitrite, UA: NEGATIVE

## 2019-10-19 NOTE — Progress Notes (Signed)
   HIGH-RISK PREGNANCY VISIT Patient name: Jasmine Pierce MRN [id number]  Date of birth: 16-Sep-2001 Chief Complaint:   High Risk Gestation  History of Present Illness:   Jasmine Pierce is a 19 y.o. G73P0010 female at [redacted]w[redacted]d with an Estimated Date of Delivery: 02/01/20 being seen today for ongoing management of a high-risk pregnancy complicated by multiple gestation DiDi twins.  Today she reports no complaints. Contractions: Not present. Vag. Bleeding: None.  Movement: Present. denies leaking of fluid.  Review of Systems:   Pertinent items are noted in HPI Denies abnormal vaginal discharge w/ itching/odor/irritation, headaches, visual changes, shortness of breath, chest pain, abdominal pain, severe nausea/vomiting, or problems with urination or bowel movements unless otherwise stated above. Pertinent History Reviewed:  Reviewed past medical,surgical, social, obstetrical and family history.  Reviewed problem list, medications and allergies. Physical Assessment:   Vitals:   10/19/19 0930  BP: 113/69  Pulse: 84  Weight: 136 lb (61.7 kg)  Body mass index is 24.09 kg/m.           Physical Examination:   General appearance: alert, well appearing, and in no distress  Mental status: alert, oriented to person, place, and time  Skin: warm & dry   Extremities: Edema: None    Cardiovascular: normal heart rate noted  Respiratory: normal respiratory effort, no distress  Abdomen: gravid, soft, non-tender  Pelvic: Cervical exam deferred         Fetal Status: Fetal Heart Rate (bpm): 145/150 Fundal Height: 32 cm Movement: Present    Fetal Surveillance Testing today: FHR x 2   Chaperone: n/a    Results for orders placed or performed in visit on 10/19/19 (from the past 24 hour(s))  POC Urinalysis Dipstick OB   Collection Time: 10/19/19  9:32 AM  Result Value Ref Range   Color, UA     Clarity, UA     Glucose, UA Negative Negative   Bilirubin, UA     Ketones, UA neg    Spec Grav, UA     Blood,  UA neg    pH, UA     POC,PROTEIN,UA Trace Negative, Trace, Small (1+), Moderate (2+), Large (3+), 4+   Urobilinogen, UA     Nitrite, UA neg    Leukocytes, UA Trace (A) Negative   Appearance     Odor      Assessment & Plan:  1) High-risk pregnancy G2P0010 at [redacted]w[redacted]d with an Estimated Date of Delivery: 02/01/20   2) DiDi twins, sonogram for EFW and PN2 next visit    Meds: No orders of the defined types were placed in this encounter.   Labs/procedures today:   Treatment Plan:  Twins protocol  Reviewed: Preterm labor symptoms and general obstetric precautions including but not limited to vaginal bleeding, contractions, leaking of fluid and fetal movement were reviewed in detail with the patient.  All questions were answered. Has home bp cuff. Rx faxed to . Check bp weekly, let us know if >140/90.   Follow-up: Return in about 3 weeks (around 11/09/2019) for EFW for twin pregnancy, PN2, HROB.  Orders Placed This Encounter  Procedures  . US OB Follow Up  . US OB Follow Up  . POC Urinalysis Dipstick OB   Lazaro Arms  10/19/2019 9:54 AM

## 2019-11-09 ENCOUNTER — Encounter: Payer: Self-pay | Admitting: Obstetrics and Gynecology

## 2019-11-09 ENCOUNTER — Other Ambulatory Visit: Payer: Self-pay

## 2019-11-09 ENCOUNTER — Ambulatory Visit (INDEPENDENT_AMBULATORY_CARE_PROVIDER_SITE_OTHER): Payer: Medicaid Other

## 2019-11-09 ENCOUNTER — Other Ambulatory Visit: Payer: Medicaid Other

## 2019-11-09 ENCOUNTER — Ambulatory Visit (INDEPENDENT_AMBULATORY_CARE_PROVIDER_SITE_OTHER): Payer: Medicaid Other | Admitting: Obstetrics and Gynecology

## 2019-11-09 VITALS — BP 102/65 | HR 70 | Wt 138.0 lb

## 2019-11-09 DIAGNOSIS — O30043 Twin pregnancy, dichorionic/diamniotic, third trimester: Secondary | ICD-10-CM

## 2019-11-09 DIAGNOSIS — O0990 Supervision of high risk pregnancy, unspecified, unspecified trimester: Secondary | ICD-10-CM

## 2019-11-09 DIAGNOSIS — Z362 Encounter for other antenatal screening follow-up: Secondary | ICD-10-CM | POA: Diagnosis not present

## 2019-11-09 DIAGNOSIS — O2243 Hemorrhoids in pregnancy, third trimester: Secondary | ICD-10-CM

## 2019-11-09 DIAGNOSIS — Z3A28 28 weeks gestation of pregnancy: Secondary | ICD-10-CM | POA: Diagnosis not present

## 2019-11-09 DIAGNOSIS — O30041 Twin pregnancy, dichorionic/diamniotic, first trimester: Secondary | ICD-10-CM | POA: Diagnosis not present

## 2019-11-09 DIAGNOSIS — Z331 Pregnant state, incidental: Secondary | ICD-10-CM

## 2019-11-09 DIAGNOSIS — O2240 Hemorrhoids in pregnancy, unspecified trimester: Secondary | ICD-10-CM | POA: Insufficient documentation

## 2019-11-09 DIAGNOSIS — O0993 Supervision of high risk pregnancy, unspecified, third trimester: Secondary | ICD-10-CM

## 2019-11-09 DIAGNOSIS — Z1389 Encounter for screening for other disorder: Secondary | ICD-10-CM

## 2019-11-09 DIAGNOSIS — Z3A37 37 weeks gestation of pregnancy: Secondary | ICD-10-CM

## 2019-11-09 LAB — POCT URINALYSIS DIPSTICK OB
Blood, UA: NEGATIVE
Glucose, UA: NEGATIVE
Ketones, UA: NEGATIVE
Leukocytes, UA: NEGATIVE
Nitrite, UA: NEGATIVE

## 2019-11-09 MED ORDER — DOCUSATE SODIUM 100 MG PO CAPS: 100.0000 mg | ORAL_CAPSULE | Freq: Two times a day (BID) | ORAL | 2 refills | Status: DC | PRN

## 2019-11-09 MED ORDER — HYDROCORTISONE (PERIANAL) 2.5 % EX CREA: 1.0000 "application " | TOPICAL_CREAM | Freq: Two times a day (BID) | CUTANEOUS | 0 refills | Status: DC

## 2019-11-09 NOTE — Progress Notes (Signed)
Subjective:  Jasmine Pierce is a 19 y.o. G2P0010 at [redacted]w[redacted]d being seen today for ongoing prenatal care.  She is currently monitored for the following issues for this high-risk pregnancy and has Asthma; Supervision of high risk pregnancy, antepartum; Dichorionic diamniotic twin gestation; and Hemorrhoids during pregnancy on their problem list.  Patient reports hemmorroids.  Contractions: Not present.  .  Movement: Present. Denies leaking of fluid.   The following portions of the patient's history were reviewed and updated as appropriate: allergies, current medications, past family history, past medical history, past social history, past surgical history and problem list. Problem list updated.  Objective:   Vitals:   11/09/19 1038  BP: 102/65  Pulse: 70  Weight: 138 lb (62.6 kg)    Fetal Status:     Movement: Present     General:  Alert, oriented and cooperative. Patient is in no acute distress.  Skin: Skin is warm and dry. No rash noted.   Cardiovascular: Normal heart rate noted  Respiratory: Normal respiratory effort, no problems with respiration noted  Abdomen: Soft, gravid, appropriate for gestational age. Pain/Pressure: Present     Pelvic:  Cervical exam deferred        Extremities: Normal range of motion.  Edema: None  Mental Status: Normal mood and affect. Normal behavior. Normal judgment and thought content.   Urinalysis:      Assessment and Plan:  Pregnancy: G2P0010 at [redacted]w[redacted]d  1. Screening for genitourinary condition  - POC Urinalysis Dipstick OB  2. Pregnant state, incidental  - POC Urinalysis Dipstick OB  3. Supervision of high risk pregnancy, antepartum Stable Glucola and 28 week labs today  4. Dichorionic diamniotic twin pregnancy in third trimester Growth scan today  5. Hemorrhoids during pregnancy in third trimester Rx to pharm  Preterm labor symptoms and general obstetric precautions including but not limited to vaginal bleeding, contractions, leaking of  fluid and fetal movement were reviewed in detail with the patient. Please refer to After Visit Summary for other counseling recommendations.  Return in about 2 weeks (around 11/23/2019) for OB visit, face to face, MD provider.   Hermina Staggers, MD

## 2019-11-09 NOTE — Progress Notes (Signed)
[place] 28 wks,DI/DI twins,cx length 3.3 cm,normal right ovary,left ovary not visualized  BABY A:cephalic,svp of fluid 7 cm,anterior placenta gr 0,fhr 127 bpm,EFW 1062 g 17.8%,discordance 1.5% BABY B:transverse head left,svp of fluid 6.4 cm,anterior placenta gr 137 bpm,LVEICF 1.6 mm,EFW 1078 g,20.5%

## 2019-11-09 NOTE — Patient Instructions (Signed)
Third Trimester of Pregnancy The third trimester is from week 28 through week 40 (months 7 through 9). The third trimester is a time when the unborn baby (fetus) is growing rapidly. At the end of the ninth month, the fetus is about 20 inches in length and weighs 6-10 pounds. Body changes during your third trimester Your body will continue to go through many changes during pregnancy. The changes vary from woman to woman. During the third trimester:  Your weight will continue to increase. You can expect to gain 25-35 pounds (11-16 kg) by the end of the pregnancy.  You may begin to get stretch marks on your hips, abdomen, and breasts.  You may urinate more often because the fetus is moving lower into your pelvis and pressing on your bladder.  You may develop or continue to have heartburn. This is caused by increased hormones that slow down muscles in the digestive tract.  You may develop or continue to have constipation because increased hormones slow digestion and cause the muscles that push waste through your intestines to relax.  You may develop hemorrhoids. These are swollen veins (varicose veins) in the rectum that can itch or be painful.  You may develop swollen, bulging veins (varicose veins) in your legs.  You may have increased body aches in the pelvis, back, or thighs. This is due to weight gain and increased hormones that are relaxing your joints.  You may have changes in your hair. These can include thickening of your hair, rapid growth, and changes in texture. Some women also have hair loss during or after pregnancy, or hair that feels dry or thin. Your hair will most likely return to normal after your baby is born.  Your breasts will continue to grow and they will continue to become tender. A yellow fluid (colostrum) may leak from your breasts. This is the first milk you are producing for your baby.  Your belly button may stick out.  You may notice more swelling in your hands,  face, or ankles.  You may have increased tingling or numbness in your hands, arms, and legs. The skin on your belly may also feel numb.  You may feel short of breath because of your expanding uterus.  You may have more problems sleeping. This can be caused by the size of your belly, increased need to urinate, and an increase in your body's metabolism.  You may notice the fetus "dropping," or moving lower in your abdomen (lightening).  You may have increased vaginal discharge.  You may notice your joints feel loose and you may have pain around your pelvic bone. What to expect at prenatal visits You will have prenatal exams every 2 weeks until week 36. Then you will have weekly prenatal exams. During a routine prenatal visit:  You will be weighed to make sure you and the baby are growing normally.  Your blood pressure will be taken.  Your abdomen will be measured to track your baby's growth.  The fetal heartbeat will be listened to.  Any test results from the previous visit will be discussed.  You may have a cervical check near your due date to see if your cervix has softened or thinned (effaced).  You will be tested for Group B streptococcus. This happens between 35 and 37 weeks. Your health care provider may ask you:  What your birth plan is.  How you are feeling.  If you are feeling the baby move.  If you have had any abnormal   symptoms, such as leaking fluid, bleeding, severe headaches, or abdominal cramping.  If you are using any tobacco products, including cigarettes, chewing tobacco, and electronic cigarettes.  If you have any questions. Other tests or screenings that may be performed during your third trimester include:  Blood tests that check for low iron levels (anemia).  Fetal testing to check the health, activity level, and growth of the fetus. Testing is done if you have certain medical conditions or if there are problems during the pregnancy.  Nonstress test  (NST). This test checks the health of your baby to make sure there are no signs of problems, such as the baby not getting enough oxygen. During this test, a belt is placed around your belly. The baby is made to move, and its heart rate is monitored during movement. What is false labor? False labor is a condition in which you feel small, irregular tightenings of the muscles in the womb (contractions) that usually go away with rest, changing position, or drinking water. These are called Braxton Hicks contractions. Contractions may last for hours, days, or even weeks before true labor sets in. If contractions come at regular intervals, become more frequent, increase in intensity, or become painful, you should see your health care provider. What are the signs of labor?  Abdominal cramps.  Regular contractions that start at 10 minutes apart and become stronger and more frequent with time.  Contractions that start on the top of the uterus and spread down to the lower abdomen and back.  Increased pelvic pressure and dull back pain.  A watery or bloody mucus discharge that comes from the vagina.  Leaking of amniotic fluid. This is also known as your "water breaking." It could be a slow trickle or a gush. Let your health care provider know if it has a color or strange odor. If you have any of these signs, call your health care provider right away, even if it is before your due date. Follow these instructions at home: Medicines  Follow your health care provider's instructions regarding medicine use. Specific medicines may be either safe or unsafe to take during pregnancy.  Take a prenatal vitamin that contains at least 600 micrograms (mcg) of folic acid.  If you develop constipation, try taking a stool softener if your health care provider approves. Eating and drinking   Eat a balanced diet that includes fresh fruits and vegetables, whole grains, good sources of protein such as meat, eggs, or tofu,  and low-fat dairy. Your health care provider will help you determine the amount of weight gain that is right for you.  Avoid raw meat and uncooked cheese. These carry germs that can cause birth defects in the baby.  If you have low calcium intake from food, talk to your health care provider about whether you should take a daily calcium supplement.  Eat four or five small meals rather than three large meals a day.  Limit foods that are high in fat and processed sugars, such as fried and sweet foods.  To prevent constipation: ? Drink enough fluid to keep your urine clear or pale yellow. ? Eat foods that are high in fiber, such as fresh fruits and vegetables, whole grains, and beans. Activity  Exercise only as directed by your health care provider. Most women can continue their usual exercise routine during pregnancy. Try to exercise for 30 minutes at least 5 days a week. Stop exercising if you experience uterine contractions.  Avoid heavy lifting.  Do   not exercise in extreme heat or humidity, or at high altitudes.  Wear low-heel, comfortable shoes.  Practice good posture.  You may continue to have sex unless your health care provider tells you otherwise. Relieving pain and discomfort  Take frequent breaks and rest with your legs elevated if you have leg cramps or low back pain.  Take warm sitz baths to soothe any pain or discomfort caused by hemorrhoids. Use hemorrhoid cream if your health care provider approves.  Wear a good support bra to prevent discomfort from breast tenderness.  If you develop varicose veins: ? Wear support pantyhose or compression stockings as told by your healthcare provider. ? Elevate your feet for 15 minutes, 3-4 times a day. Prenatal care  Write down your questions. Take them to your prenatal visits.  Keep all your prenatal visits as told by your health care provider. This is important. Safety  Wear your seat belt at all times when driving.  Make  a list of emergency phone numbers, including numbers for family, friends, the hospital, and police and fire departments. General instructions  Avoid cat litter boxes and soil used by cats. These carry germs that can cause birth defects in the baby. If you have a cat, ask someone to clean the litter box for you.  Do not travel far distances unless it is absolutely necessary and only with the approval of your health care provider.  Do not use hot tubs, steam rooms, or saunas.  Do not drink alcohol.  Do not use any products that contain nicotine or tobacco, such as cigarettes and e-cigarettes. If you need help quitting, ask your health care provider.  Do not use any medicinal herbs or unprescribed drugs. These chemicals affect the formation and growth of the baby.  Do not douche or use tampons or scented sanitary pads.  Do not cross your legs for long periods of time.  To prepare for the arrival of your baby: ? Take prenatal classes to understand, practice, and ask questions about labor and delivery. ? Make a trial run to the hospital. ? Visit the hospital and tour the maternity area. ? Arrange for maternity or paternity leave through employers. ? Arrange for family and friends to take care of pets while you are in the hospital. ? Purchase a rear-facing car seat and make sure you know how to install it in your car. ? Pack your hospital bag. ? Prepare the baby's nursery. Make sure to remove all pillows and stuffed animals from the baby's crib to prevent suffocation.  Visit your dentist if you have not gone during your pregnancy. Use a soft toothbrush to brush your teeth and be gentle when you floss. Contact a health care provider if:  You are unsure if you are in labor or if your water has broken.  You become dizzy.  You have mild pelvic cramps, pelvic pressure, or nagging pain in your abdominal area.  You have lower back pain.  You have persistent nausea, vomiting, or  diarrhea.  You have an unusual or bad smelling vaginal discharge.  You have pain when you urinate. Get help right away if:  Your water breaks before 37 weeks.  You have regular contractions less than 5 minutes apart before 37 weeks.  You have a fever.  You are leaking fluid from your vagina.  You have spotting or bleeding from your vagina.  You have severe abdominal pain or cramping.  You have rapid weight loss or weight gain.  You have   shortness of breath with chest pain.  You notice sudden or extreme swelling of your face, hands, ankles, feet, or legs.  Your baby makes fewer than 10 movements in 2 hours.  You have severe headaches that do not go away when you take medicine.  You have vision changes. Summary  The third trimester is from week 28 through week 40, months 7 through 9. The third trimester is a time when the unborn baby (fetus) is growing rapidly.  During the third trimester, your discomfort may increase as you and your baby continue to gain weight. You may have abdominal, leg, and back pain, sleeping problems, and an increased need to urinate.  During the third trimester your breasts will keep growing and they will continue to become tender. A yellow fluid (colostrum) may leak from your breasts. This is the first milk you are producing for your baby.  False labor is a condition in which you feel small, irregular tightenings of the muscles in the womb (contractions) that eventually go away. These are called Braxton Hicks contractions. Contractions may last for hours, days, or even weeks before true labor sets in.  Signs of labor can include: abdominal cramps; regular contractions that start at 10 minutes apart and become stronger and more frequent with time; watery or bloody mucus discharge that comes from the vagina; increased pelvic pressure and dull back pain; and leaking of amniotic fluid. This information is not intended to replace advice given to you by your  health care provider. Make sure you discuss any questions you have with your health care provider. Document Revised: 01/18/2019 Document Reviewed: 11/02/2016 [employer] Patient Education  2020 [employer]  

## 2019-11-10 LAB — CBC
Hematocrit: 38.3 % (ref 34.0–46.6)
Hemoglobin: 12.7 g/dL (ref 11.1–15.9)
MCH: 30 pg (ref 26.6–33.0)
MCHC: 33.2 g/dL (ref 31.5–35.7)
MCV: 90 fL (ref 79–97)
Platelets: 220 10*3/uL (ref 150–450)
RBC: 4.24 x10E6/uL (ref 3.77–5.28)
RDW: 13 % (ref 11.7–15.4)
WBC: 10.5 10*3/uL (ref 3.4–10.8)

## 2019-11-10 LAB — ANTIBODY SCREEN: Antibody Screen: NEGATIVE

## 2019-11-10 LAB — GLUCOSE TOLERANCE, 2 HOURS W/ 1HR
Glucose, 1 hour: 78 mg/dL (ref 65–179)
Glucose, 2 hour: 79 mg/dL (ref 65–152)
Glucose, Fasting: 69 mg/dL (ref 65–91)

## 2019-11-10 LAB — HIV ANTIBODY (ROUTINE TESTING W REFLEX): HIV Screen 4th Generation wRfx: NONREACTIVE

## 2019-11-10 LAB — RPR: RPR Ser Ql: NONREACTIVE

## 2019-11-23 ENCOUNTER — Encounter: Payer: Self-pay | Admitting: Obstetrics & Gynecology

## 2019-11-23 ENCOUNTER — Ambulatory Visit (INDEPENDENT_AMBULATORY_CARE_PROVIDER_SITE_OTHER): Payer: Medicaid Other | Admitting: Obstetrics & Gynecology

## 2019-11-23 ENCOUNTER — Other Ambulatory Visit: Payer: Self-pay

## 2019-11-23 VITALS — BP 109/68 | HR 73 | Wt 140.0 lb

## 2019-11-23 DIAGNOSIS — O0993 Supervision of high risk pregnancy, unspecified, third trimester: Secondary | ICD-10-CM

## 2019-11-23 DIAGNOSIS — Z3A30 30 weeks gestation of pregnancy: Secondary | ICD-10-CM

## 2019-11-23 DIAGNOSIS — Z331 Pregnant state, incidental: Secondary | ICD-10-CM

## 2019-11-23 DIAGNOSIS — Z1389 Encounter for screening for other disorder: Secondary | ICD-10-CM

## 2019-11-23 DIAGNOSIS — O30043 Twin pregnancy, dichorionic/diamniotic, third trimester: Secondary | ICD-10-CM

## 2019-11-23 LAB — POCT URINALYSIS DIPSTICK OB
Blood, UA: NEGATIVE
Glucose, UA: NEGATIVE
Ketones, UA: NEGATIVE
Leukocytes, UA: NEGATIVE
Nitrite, UA: NEGATIVE
POC,PROTEIN,UA: NEGATIVE

## 2019-11-23 NOTE — Progress Notes (Signed)
error 

## 2019-11-23 NOTE — Progress Notes (Signed)
Patient ID: Jasmine Pierce, female   DOB: 06-20-2001, 19 y.o.   MRN: [id number]   [facility] PREGNANCY VISIT Patient name: Jasmine Pierce MRN [id number]  Date of birth: 2001/04/27 Chief Complaint:   Routine Prenatal Visit  History of Present Illness:   Jasmine Pierce is a 19 y.o. G38P0010 female at [redacted]w[redacted]d with an Estimated Date of Delivery: 02/01/20 being seen today for ongoing management of a high-risk pregnancy complicated by Mercie Eon twins.  Today she reports no complaints. Contractions: Irregular. Vag. Bleeding: None.  Movement: Present. denies leaking of fluid.  Review of Systems:   Pertinent items are noted in HPI Denies abnormal vaginal discharge w/ itching/odor/irritation, headaches, visual changes, shortness of breath, chest pain, abdominal pain, severe nausea/vomiting, or problems with urination or bowel movements unless otherwise stated above. Pertinent History Reviewed:  Reviewed past medical,surgical, social, obstetrical and family history.  Reviewed problem list, medications and allergies. Physical Assessment:   Vitals:   11/23/19 0903  BP: 109/68  Pulse: 73  Weight: 140 lb (63.5 kg)  Body mass index is 24.8 kg/m.           Physical Examination:   General appearance: alert, well appearing, and in no distress  Mental status: alert, oriented to person, place, and time  Skin: warm & dry   Extremities: Edema: Trace    Cardiovascular: normal heart rate noted  Respiratory: normal respiratory effort, no distress  Abdomen: gravid, soft, non-tender  Pelvic: Cervical exam deferred         Fetal Status:     Movement: Present    Fetal Surveillance Testing today: FHR x 2 145/136   Chaperone: n/a    Results for orders placed or performed in visit on 11/23/19 (from the past 24 hour(s))  POC Urinalysis Dipstick OB   Collection Time: 11/23/19  9:06 AM  Result Value Ref Range   Color, UA     Clarity, UA     Glucose, UA Negative Negative   Bilirubin, UA     Ketones, UA n    Spec  Grav, UA     Blood, UA n    pH, UA     POC,PROTEIN,UA Negative Negative, Trace, Small (1+), Moderate (2+), Large (3+), 4+   Urobilinogen, UA     Nitrite, UA n    Leukocytes, UA Negative Negative   Appearance     Odor      Assessment & Plan:  1) High-risk pregnancy G2P0010 at [redacted]w[redacted]d with an Estimated Date of Delivery: 02/01/20   2) DiDi twins, stable    Meds: No orders of the defined types were placed in this encounter.   Labs/procedures today:   Treatment Plan:  Sonogram 2 weeks for EFW  Reviewed: Preterm labor symptoms and general obstetric precautions including but not limited to vaginal bleeding, contractions, leaking of fluid and fetal movement were reviewed in detail with the patient.  All questions were answered.  home bp cuff. Rx faxed to . Check bp weekly, let us know if >140/90.   Follow-up: No follow-ups on file.  Orders Placed This Encounter  Procedures  . POC Urinalysis Dipstick OB   Lazaro Arms  11/23/2019 9:17 AM

## 2019-12-06 ENCOUNTER — Other Ambulatory Visit: Payer: Self-pay | Admitting: Obstetrics & Gynecology

## 2019-12-06 DIAGNOSIS — O30043 Twin pregnancy, dichorionic/diamniotic, third trimester: Secondary | ICD-10-CM

## 2019-12-07 ENCOUNTER — Other Ambulatory Visit: Payer: Self-pay

## 2019-12-07 ENCOUNTER — Ambulatory Visit (INDEPENDENT_AMBULATORY_CARE_PROVIDER_SITE_OTHER): Payer: Medicaid Other

## 2019-12-07 ENCOUNTER — Ambulatory Visit (INDEPENDENT_AMBULATORY_CARE_PROVIDER_SITE_OTHER): Payer: Medicaid Other | Admitting: Advanced Practice Midwife

## 2019-12-07 VITALS — BP 116/67 | HR 77 | Wt 143.0 lb

## 2019-12-07 DIAGNOSIS — O321XX1 Maternal care for breech presentation, fetus 1: Secondary | ICD-10-CM

## 2019-12-07 DIAGNOSIS — O322XX2 Maternal care for transverse and oblique lie, fetus 2: Secondary | ICD-10-CM | POA: Diagnosis not present

## 2019-12-07 DIAGNOSIS — Z1389 Encounter for screening for other disorder: Secondary | ICD-10-CM

## 2019-12-07 DIAGNOSIS — O0990 Supervision of high risk pregnancy, unspecified, unspecified trimester: Secondary | ICD-10-CM

## 2019-12-07 DIAGNOSIS — Z362 Encounter for other antenatal screening follow-up: Secondary | ICD-10-CM

## 2019-12-07 DIAGNOSIS — O0993 Supervision of high risk pregnancy, unspecified, third trimester: Secondary | ICD-10-CM

## 2019-12-07 DIAGNOSIS — Z3A32 32 weeks gestation of pregnancy: Secondary | ICD-10-CM

## 2019-12-07 DIAGNOSIS — O30043 Twin pregnancy, dichorionic/diamniotic, third trimester: Secondary | ICD-10-CM

## 2019-12-07 DIAGNOSIS — Z331 Pregnant state, incidental: Secondary | ICD-10-CM

## 2019-12-07 LAB — POCT URINALYSIS DIPSTICK OB
Blood, UA: NEGATIVE
Glucose, UA: NEGATIVE
Ketones, UA: NEGATIVE
Leukocytes, UA: NEGATIVE
Nitrite, UA: NEGATIVE
POC,PROTEIN,UA: NEGATIVE

## 2019-12-07 NOTE — Progress Notes (Signed)
[place] 32 wks,DI/DI twins,cx 3.4 cm BABY A:frank breech,anterior placenta gr 1,svp of fluid 6.4 cm,fhr 152 bpm,EFW 1723 g 19% BABY B:transverse head left,anterior placenta gr 1,svp of fluid 6.6 cm,fhr 121 bpm,EFW 1682 g 15%,discordance 2.4%

## 2019-12-07 NOTE — Patient Instructions (Signed)
Jasmine Pierce, I greatly value your feedback.  If you receive a survey following your visit with [place] today, we appreciate you taking the time to fill it out.  Thanks, Philipp Deputy CNM  [facility] HAS MOVED!!! It is now [facility] at [facility] ([address] [place], [place] [address]) Entrance located off of [address] Free 24/7 valet parking    Go to [email or web address] to register for FREE online childbirth classes   Call the office [phone]) or go to [facility] if:  You begin to have strong, frequent contractions  Your water breaks.  Sometimes it is a big gush of fluid, sometimes it is just a trickle that keeps getting your panties wet or running down your legs  You have vaginal bleeding.  It is normal to have a small amount of spotting if your cervix was checked.   You don't feel your baby moving like normal.  If you don't, get you something to eat and drink and lay down and focus on feeling your baby move.  You should feel at least 10 movements in 2 hours.  If you don't, you should call the office or go to [facility].    Tdap Vaccine  It is recommended that you get the Tdap vaccine during the third trimester of EACH pregnancy to help protect your baby from getting pertussis (whooping cough)  27-36 weeks is the BEST time to do this so that you can pass the protection on to your baby. During pregnancy is better than after pregnancy, but if you are unable to get it during pregnancy it will be offered at the hospital.   You can get this vaccine with [place], at the health department, your family doctor, or some local pharmacies  Everyone who will be around your baby should also be up-to-date on their vaccines before the baby comes. Adults (who are not pregnant) only need 1 dose of Tdap during adulthood.   [place] Pediatricians/Family Doctors:  [facility] [phone]            [facility] [phone]                  [facility] [phone] (usually not accepting new patients unless you have family there already, you are always welcome to call and ask)       [facility] Department [phone]       [facility] Pediatricians/Family Doctors:   [facility]: [phone]  [facility]: [phone]  [facility]: [phone]  [facility] Doctors:   [facility]: [phone]   [facility]: [phone]  [facility] Doctors:  [facility]: [phone]   Home Blood Pressure Monitoring for Patients   Your provider has recommended that you check your blood pressure (BP) at least once a week at home. If you do not have a blood pressure cuff at home, one will be provided for you. Contact your provider if you have not received your monitor within 1 week.   Helpful Tips for Accurate Home Blood Pressure Checks  . Don't smoke, exercise, or drink caffeine 30 minutes before checking your BP . Use the restroom before checking your BP (a full bladder can raise your pressure) . Relax in a comfortable upright chair . Feet on the ground . Left arm resting comfortably on a flat surface at the level of your heart . Legs uncrossed . Back supported . Sit quietly and don't talk .  Place the cuff on your bare arm . Adjust snuggly, so that only two fingertips can fit between your skin and the top of the cuff . Check 2 readings separated by at least one minute . Keep a log of your BP readings . For a visual, please reference this diagram: http://ccnc.care/bpdiagram  Provider Name: [facility]     Phone: [phone]  Zone 1: ALL CLEAR  Continue to monitor your symptoms:  . BP reading is less than 140 (top number) or less than 90 (bottom number)  . No right upper stomach pain . No headaches or seeing spots . No feeling nauseated or throwing up . No swelling in face and  hands  Zone 2: CAUTION Call your doctor's office for any of the following:  . BP reading is greater than 140 (top number) or greater than 90 (bottom number)  . Stomach pain under your ribs in the middle or right side . Headaches or seeing spots . Feeling nauseated or throwing up . Swelling in face and hands  Zone 3: EMERGENCY  Seek immediate medical care if you have any of the following:  . BP reading is greater than160 (top number) or greater than 110 (bottom number) . Severe headaches not improving with Tylenol . Serious difficulty catching your breath . Any worsening symptoms from Zone 2   Third Trimester of Pregnancy The third trimester is from week 29 through week 42, months 7 through 9. The third trimester is a time when the fetus is growing rapidly. At the end of the ninth month, the fetus is about 20 inches in length and weighs 6-10 pounds.  BODY CHANGES Your body goes through many changes during pregnancy. The changes vary from woman to woman.   Your weight will continue to increase. You can expect to gain 25-35 pounds (11-16 kg) by the end of the pregnancy.  You may begin to get stretch marks on your hips, abdomen, and breasts.  You may urinate more often because the fetus is moving lower into your pelvis and pressing on your bladder.  You may develop or continue to have heartburn as a result of your pregnancy.  You may develop constipation because certain hormones are causing the muscles that push waste through your intestines to slow down.  You may develop hemorrhoids or swollen, bulging veins (varicose veins).  You may have pelvic pain because of the weight gain and pregnancy hormones relaxing your joints between the bones in your pelvis. Backaches may result from overexertion of the muscles supporting your posture.  You may have changes in your hair. These can include thickening of your hair, rapid growth, and changes in texture. Some women also have hair loss during  or after pregnancy, or hair that feels dry or thin. Your hair will most likely return to normal after your baby is born.  Your breasts will continue to grow and be tender. A yellow discharge may leak from your breasts called colostrum.  Your belly button may stick out.  You may feel short of breath because of your expanding uterus.  You may notice the fetus "dropping," or moving lower in your abdomen.  You may have a bloody mucus discharge. This usually occurs a few days to a week before labor begins.  Your cervix becomes thin and soft (effaced) near your due date. WHAT TO EXPECT AT YOUR PRENATAL EXAMS  You will have prenatal exams every 2 weeks until week 36. Then, you will have weekly prenatal exams. During  a routine prenatal visit:  You will be weighed to make sure you and the fetus are growing normally.  Your blood pressure is taken.  Your abdomen will be measured to track your baby's growth.  The fetal heartbeat will be listened to.  Any test results from the previous visit will be discussed.  You may have a cervical check near your due date to see if you have effaced. At around 36 weeks, your caregiver will check your cervix. At the same time, your caregiver will also perform a test on the secretions of the vaginal tissue. This test is to determine if a type of bacteria, Group B streptococcus, is present. Your caregiver will explain this further. Your caregiver may ask you:  What your birth plan is.  How you are feeling.  If you are feeling the baby move.  If you have had any abnormal symptoms, such as leaking fluid, bleeding, severe headaches, or abdominal cramping.  If you have any questions. Other tests or screenings that may be performed during your third trimester include:  Blood tests that check for low iron levels (anemia).  Fetal testing to check the health, activity level, and growth of the fetus. Testing is done if you have certain medical conditions or if  there are problems during the pregnancy. FALSE LABOR You may feel small, irregular contractions that eventually go away. These are called Braxton Hicks contractions, or false labor. Contractions may last for hours, days, or even weeks before true labor sets in. If contractions come at regular intervals, intensify, or become painful, it is best to be seen by your caregiver.  SIGNS OF LABOR   Menstrual-like cramps.  Contractions that are 5 minutes apart or less.  Contractions that start on the top of the uterus and spread down to the lower abdomen and back.  A sense of increased pelvic pressure or back pain.  A watery or bloody mucus discharge that comes from the vagina. If you have any of these signs before the 37th week of pregnancy, call your caregiver right away. You need to go to the hospital to get checked immediately. HOME CARE INSTRUCTIONS   Avoid all smoking, herbs, alcohol, and unprescribed drugs. These chemicals affect the formation and growth of the baby.  Follow your caregiver's instructions regarding medicine use. There are medicines that are either safe or unsafe to take during pregnancy.  Exercise only as directed by your caregiver. Experiencing uterine cramps is a good sign to stop exercising.  Continue to eat regular, healthy meals.  Wear a good support bra for breast tenderness.  Do not use hot tubs, steam rooms, or saunas.  Wear your seat belt at all times when driving.  Avoid raw meat, uncooked cheese, cat litter boxes, and soil used by cats. These carry germs that can cause birth defects in the baby.  Take your prenatal vitamins.  Try taking a stool softener (if your caregiver approves) if you develop constipation. Eat more high-fiber foods, such as fresh vegetables or fruit and whole grains. Drink plenty of fluids to keep your urine clear or pale yellow.  Take warm sitz baths to soothe any pain or discomfort caused by hemorrhoids. Use hemorrhoid cream if your  caregiver approves.  If you develop varicose veins, wear support hose. Elevate your feet for 15 minutes, 3-4 times a day. Limit salt in your diet.  Avoid heavy lifting, wear low heal shoes, and practice good posture.  Rest a lot with your legs elevated if you  have leg cramps or low back pain.  Visit your dentist if you have not gone during your pregnancy. Use a soft toothbrush to brush your teeth and be gentle when you floss.  A sexual relationship may be continued unless your caregiver directs you otherwise.  Do not travel far distances unless it is absolutely necessary and only with the approval of your caregiver.  Take prenatal classes to understand, practice, and ask questions about the labor and delivery.  Make a trial run to the hospital.  Pack your hospital bag.  Prepare the baby's nursery.  Continue to go to all your prenatal visits as directed by your caregiver. SEEK MEDICAL CARE IF:  You are unsure if you are in labor or if your water has broken.  You have dizziness.  You have mild pelvic cramps, pelvic pressure, or nagging pain in your abdominal area.  You have persistent nausea, vomiting, or diarrhea.  You have a bad smelling vaginal discharge.  You have pain with urination. SEEK IMMEDIATE MEDICAL CARE IF:   You have a fever.  You are leaking fluid from your vagina.  You have spotting or bleeding from your vagina.  You have severe abdominal cramping or pain.  You have rapid weight loss or gain.  You have shortness of breath with chest pain.  You notice sudden or extreme swelling of your face, hands, ankles, feet, or legs.  You have not felt your baby move in over an hour.  You have severe headaches that do not go away with medicine.  You have vision changes. Document Released: 09/21/2001 Document Revised: 10/02/2013 Document Reviewed: 11/28/2012 [facility] Patient Information 2015 [place], [place]. This information is not intended to replace advice  given to you by your health care provider. Make sure you discuss any questions you have with your health care provider.  PROTECT YOURSELF & YOUR BABY FROM THE FLU! Because you are pregnant, we at [facility], along with the Centers for Disease Control (CDC), recommend that you receive the flu vaccine to protect yourself and your baby from the flu. The flu is more likely to cause severe illness in pregnant women than in women of reproductive age who are not pregnant. Changes in the immune system, heart, and lungs during pregnancy make pregnant women (and women up to two weeks postpartum) more prone to severe illness from flu, including illness resulting in hospitalization. Flu also may be harmful for a pregnant woman's developing baby. A common flu symptom is fever, which may be associated with neural tube defects and other adverse outcomes for a developing baby. Getting vaccinated can also help protect a baby after birth from flu. (Mom passes antibodies onto the developing baby during her pregnancy.)  A Flu Vaccine is the Best Protection Against Flu Getting a flu vaccine is the first and most important step in protecting against flu. Pregnant women should get a flu shot and not the live attenuated influenza vaccine (LAIV), also known as nasal spray flu vaccine. Flu vaccines given during pregnancy help protect both the mother and her baby from flu. Vaccination has been shown to reduce the risk of flu-associated acute respiratory infection in pregnant women by up to one-half. A 2018 study showed that getting a flu shot reduced a pregnant woman's risk of being hospitalized with flu by an average of 40 percent. Pregnant women who get a flu vaccine are also helping to protect their babies from flu illness for the first several months after their birth, when they are too  young to get vaccinated.   A Long Record of Safety for Flu Shots in Pregnant Women Flu shots have been given to millions of pregnant women over  many years with a good safety record. There is a lot of evidence that flu vaccines can be given safely during pregnancy; though these data are limited for the first trimester. The CDC recommends that pregnant women get vaccinated during any trimester of their pregnancy. It is very important for pregnant women to get the flu shot.   Other Preventive Actions In addition to getting a flu shot, pregnant women should take the same everyday preventive actions the CDC recommends of everyone, including covering coughs, washing hands often, and avoiding people who are sick.  Symptoms and Treatment If you get sick with flu symptoms call your doctor right away. There are antiviral drugs that can treat flu illness and prevent serious flu complications. The CDC recommends prompt treatment for people who have influenza infection or suspected influenza infection and who are at high risk of serious flu complications, such as people with asthma, diabetes (including gestational diabetes), or heart disease. Early treatment of influenza in hospitalized pregnant women has been shown to reduce the length of the hospital stay.  Symptoms Flu symptoms include fever, cough, sore throat, runny or stuffy nose, body aches, headache, chills and fatigue. Some people may also have vomiting and diarrhea. People may be infected with the flu and have respiratory symptoms without a fever.  Early Treatment is Important for Pregnant Women Treatment should begin as soon as possible because antiviral drugs work best when started early (within 48 hours after symptoms start). Antiviral drugs can make your flu illness milder and make you feel better faster. They may also prevent serious health problems that can result from flu illness. Oral oseltamivir (Tamiflu) is the preferred treatment for pregnant women because it has the most studies available to suggest that it is safe and beneficial. Antiviral drugs require a prescription from your  provider. Having a fever caused by flu infection or other infections early in pregnancy may be linked to birth defects in a baby. In addition to taking antiviral drugs, pregnant women who get a fever should treat their fever with Tylenol (acetaminophen) and contact their provider immediately.  When to [place] If you are pregnant and have any of these signs, seek care immediately:  Difficulty breathing or shortness of breath  Pain or pressure in the chest or abdomen  Sudden dizziness  Confusion  Severe or persistent vomiting  High fever that is not responding to Tylenol (or store brand equivalent)  Decreased or no movement of your baby  [email or web address]

## 2019-12-07 NOTE — Progress Notes (Signed)
   HIGH-RISK PREGNANCY VISIT Patient name: Jasmine Pierce MRN [id number]  Date of birth: 06/22/2001 Chief Complaint:   Routine Prenatal Visit  History of Present Illness:   Jasmine Pierce is a 19 y.o. G65P0010 female at [redacted]w[redacted]d with an Estimated Date of Delivery: 02/01/20 being seen today for ongoing management of a high-risk pregnancy complicated by multiple gestation di/di twins.  Today she reports no complaints. Contractions: Not present. Vag. Bleeding: None.  Movement: Present. denies leaking of fluid.  Review of Systems:   Pertinent items are noted in HPI Denies abnormal vaginal discharge w/ itching/odor/irritation, headaches, visual changes, shortness of breath, chest pain, abdominal pain, severe nausea/vomiting, or problems with urination or bowel movements unless otherwise stated above. Pertinent History Reviewed:  Reviewed past medical,surgical, social, obstetrical and family history.  Reviewed problem list, medications and allergies. Physical Assessment:   Vitals:   12/07/19 1251  BP: 116/67  Pulse: 77  Weight: 143 lb (64.9 kg)  Body mass index is 25.33 kg/m.           Physical Examination:   General appearance: alert, well appearing, and in no distress  Mental status: alert, oriented to person, place, and time  Skin: warm & dry   Extremities: Edema: Trace    Cardiovascular: normal heart rate noted  Respiratory: normal respiratory effort, no distress  Abdomen: gravid, soft, non-tender  Pelvic: Cervical exam deferred         Fetal Status: Fetal Heart Rate (bpm): 152/121   Movement: Present    Fetal Surveillance Testing today: [place] 32 wks,DI/DI twins,cx 3.4 cm BABY A:frank breech,anterior placenta gr 1,svp of fluid 6.4 cm,fhr 152 bpm,EFW 1723 g 19% BABY B:transverse head left,anterior placenta gr 1,svp of fluid 6.6 cm,fhr 121 bpm,EFW 1682 g 15%,discordance 2.4%    Results for orders placed or performed in visit on 12/07/19 (from the past 24 hour(s))  POC Urinalysis Dipstick  OB   Collection Time: 12/07/19 12:55 PM  Result Value Ref Range   Color, UA     Clarity, UA     Glucose, UA Negative Negative   Bilirubin, UA     Ketones, UA neg    Spec Grav, UA     Blood, UA neg    pH, UA     POC,PROTEIN,UA Negative Negative, Trace, Small (1+), Moderate (2+), Large (3+), 4+   Urobilinogen, UA     Nitrite, UA neg    Leukocytes, UA Negative Negative   Appearance     Odor      Assessment & Plan:  1) High-risk pregnancy G2P0010 at [redacted]w[redacted]d with an Estimated Date of Delivery: 02/01/20   2) Di/di twins, 2% discordancy; A frank breech, B transverse; repeat growth scan at 36wks   Meds: No orders of the defined types were placed in this encounter.   Labs/procedures today: growth scan; urine GC/chlam collected (not done at [facility])  Treatment Plan:  At 36wks: growth scan/BPP, then 2x/week testing until delivery at 38wks  Reviewed: Preterm labor symptoms and general obstetric precautions including but not limited to vaginal bleeding, contractions, leaking of fluid and fetal movement were reviewed in detail with the patient.  All questions were answered.   Follow-up: Return in about 2 weeks (around 12/21/2019) for HROB, in person.  Orders Placed This Encounter  Procedures  . GC/Chlamydia Probe Amp  . POC Urinalysis Dipstick OB   Arabella Merles CNM 12/07/2019 1:16 PM

## 2019-12-09 LAB — GC/CHLAMYDIA PROBE AMP
Chlamydia trachomatis, NAA: NEGATIVE
Neisseria Gonorrhoeae by PCR: NEGATIVE

## 2019-12-12 ENCOUNTER — Other Ambulatory Visit: Payer: Self-pay | Admitting: Women's Health

## 2019-12-12 MED ORDER — PANTOPRAZOLE SODIUM 20 MG PO TBEC: 20.0000 mg | DELAYED_RELEASE_TABLET | Freq: Every day | ORAL | 6 refills | Status: DC

## 2019-12-18 ENCOUNTER — Other Ambulatory Visit: Payer: Self-pay

## 2019-12-18 ENCOUNTER — Other Ambulatory Visit: Payer: Medicaid Other

## 2019-12-18 DIAGNOSIS — L299 Pruritus, unspecified: Secondary | ICD-10-CM

## 2019-12-20 LAB — COMPREHENSIVE METABOLIC PANEL
ALT: 17 IU/L (ref 0–32)
AST: 26 IU/L (ref 0–40)
Albumin/Globulin Ratio: 1.5 (ref 1.2–2.2)
Albumin: 3.7 g/dL — ABNORMAL LOW (ref 3.9–5.0)
Alkaline Phosphatase: 129 IU/L — ABNORMAL HIGH (ref 39–117)
BUN/Creatinine Ratio: 9 (ref 9–23)
BUN: 6 mg/dL (ref 6–20)
Bilirubin Total: 0.4 mg/dL (ref 0.0–1.2)
CO2: 19 mmol/L — ABNORMAL LOW (ref 20–29)
Calcium: 8.9 mg/dL (ref 8.7–10.2)
Chloride: 103 mmol/L (ref 96–106)
Creatinine, Ser: 0.7 mg/dL (ref 0.57–1.00)
GFR calc Af Amer: 144 mL/min/{1.73_m2} (ref >59)
GFR calc non Af Amer: 125 mL/min/{1.73_m2} (ref >59)
Globulin, Total: 2.5 g/dL (ref 1.5–4.5)
Glucose: 66 mg/dL (ref 65–99)
Potassium: 4 mmol/L (ref 3.5–5.2)
Sodium: 137 mmol/L (ref 134–144)
Total Protein: 6.2 g/dL (ref 6.0–8.5)

## 2019-12-20 LAB — BILE ACIDS, TOTAL: Bile Acids Total: 1.5 umol/L (ref 0.0–10.0)

## 2019-12-21 ENCOUNTER — Other Ambulatory Visit: Payer: Self-pay

## 2019-12-21 ENCOUNTER — Ambulatory Visit (INDEPENDENT_AMBULATORY_CARE_PROVIDER_SITE_OTHER): Payer: Medicaid Other | Admitting: Advanced Practice Midwife

## 2019-12-21 VITALS — BP 110/65 | HR 62 | Wt 146.0 lb

## 2019-12-21 DIAGNOSIS — O30043 Twin pregnancy, dichorionic/diamniotic, third trimester: Secondary | ICD-10-CM

## 2019-12-21 DIAGNOSIS — Z3A34 34 weeks gestation of pregnancy: Secondary | ICD-10-CM

## 2019-12-21 DIAGNOSIS — O0990 Supervision of high risk pregnancy, unspecified, unspecified trimester: Secondary | ICD-10-CM

## 2019-12-21 DIAGNOSIS — L299 Pruritus, unspecified: Secondary | ICD-10-CM

## 2019-12-21 NOTE — Progress Notes (Addendum)
   PRENATAL VISIT NOTE  Subjective:  Jasmine Pierce is a 19 y.o. G2P0010 at [redacted]w[redacted]d being seen today for ongoing prenatal care.  She is currently monitored for the following issues for this high-risk pregnancy and has Asthma; Supervision of high risk pregnancy, antepartum; Dichorionic diamniotic twin gestation; and Hemorrhoids during pregnancy on their problem list.  Patient reports ongoing itchiness which worsens at night. She endorses patches of flat red rash on her arms and feet which she is managing with her usual soap and lotion..  Contractions: Not present. Vag. Bleeding: None.  Movement: Present. Denies leaking of fluid.   The following portions of the patient's history were reviewed and updated as appropriate: allergies, current medications, past family history, past medical history, past social history, past surgical history and problem list. Problem list updated.  Objective:   Vitals:   12/21/19 1035  BP: 110/65  Pulse: 62  Weight: 146 lb (66.2 kg)    Fetal Status: Fetal Heart Rate (bpm): 128/136 Fundal Height: 38 cm Movement: Present     General:  Alert, oriented and cooperative. Patient is in no acute distress.  Skin: Skin is warm and dry. No rash noted.   Cardiovascular: Normal heart rate noted  Respiratory: Normal respiratory effort, no problems with respiration noted  Abdomen: Soft, gravid, appropriate for gestational age.  Pain/Pressure: Absent     Pelvic: Cervical exam deferred        Extremities: Normal range of motion.  Edema: Trace  Mental Status: Normal mood and affect. Normal behavior. Normal judgment and thought content.           Assessment and Plan:  Pregnancy: G2P0010 at [redacted]w[redacted]d  1. Supervision of high risk pregnancy, antepartum  2. Dichorionic diamniotic twin pregnancy in third trimester - Reassuring surveillance last week - Initiate previously advise schedule for growth scans and biweekly testing at 36 weeks - Confirmed plan delivery at 38 weeks  3. Pruritus  - Scant areas of red raised rash on arms  - Advised trial of Benadryl - S/p normal labs for ICP three days ago - Patient RTC for repeat labs Monday 03/15 - Discussed with Dr. Emelda Fear, who agrees with my plan of care  Preterm labor symptoms and general obstetric precautions including but not limited to vaginal bleeding, contractions, leaking of fluid and fetal movement were reviewed in detail with the patient. Please refer to After Visit Summary for other counseling recommendations.    Future Appointments  Date Time Provider Department Center  12/24/2019  9:30 AM [facility]  01/04/2020 11:00 AM [facility] [place] [facility] None  01/04/2020 12:10 PM Eure, Amaryllis Dyke, MD [facility]    Calvert Cantor, CNM

## 2019-12-21 NOTE — Patient Instructions (Signed)
National Guideline Alliance ([place]).Twin and Triplet Pregnancy. [place]: National Institute for Health and Care Excellence (UK); 2019.">  Multiple Pregnancy Multiple pregnancy means that a woman is carrying more than one baby at a time. She may be pregnant with twins, triplets, or more. The majority of multiple pregnancies are twins. Naturally conceiving triplets or more (higher-order multiples) is rare. Multiple pregnancies are riskier than single pregnancies. A woman with a multiple pregnancy is more likely to have certain problems during her pregnancy. How does a multiple pregnancy happen? A multiple pregnancy happens when:  The woman's body releases more than one egg at a time, and then each egg gets fertilized by a different sperm. ? This is the most common type of multiple pregnancy. ? Twins or other multiples produced this way are called fraternal. They are no more alike than non-multiple siblings are.  One sperm fertilizes one egg, which then divides into more than one embryo. ? Twins or other multiples produced this way are called identical. Identical multiples are always the same gender, and they look very much alike. Who is most likely to have a multiple pregnancy? A multiple pregnancy is more likely to develop in women who:  Have had fertility treatment, especially if the treatment included fertility medicines.  Are older than 21 years of age.  Have already had four or more children.  Have a family history of multiple pregnancy. How is a multiple pregnancy diagnosed? A multiple pregnancy may be diagnosed based on:  Symptoms such as: ? Rapid weight gain in the first 3 months of pregnancy (first trimester). ? More severe nausea and breast tenderness than what is typical of a single pregnancy. ? A larger uterus than what is normal for the stage of the pregnancy.  Blood tests that detect a higher-than-normal level of human chorionic gonadotropin (hCG). This is a hormone that your  body produces in early pregnancy.  An ultrasound exam. This is used to confirm that you are carrying multiples. What risks come with multiple pregnancy? A multiple pregnancy puts you at a higher risk for certain problems during or after your pregnancy. These include:  Delivering your babies before your due date (preterm birth). A full-term pregnancy lasts for at least 37 weeks. ? Babies born before 37 weeks may have a higher risk for breathing problems, feeding difficulties, cerebral palsy, and learning disabilities.  Diabetes.  Preeclampsia. This is a serious condition that causes high blood pressure and headaches during pregnancy.  Too much blood loss after childbirth (postpartum hemorrhage).  Postpartum depression.  Low birth weight of the babies. How will having a multiple pregnancy affect my care? Your health care team will monitor you more closely. You may need more frequent prenatal visits. This will ensure that you are healthy and that your babies are growing normally. Follow these instructions at home: Eating and drinking  Increase your nutrition. ? Follow your health care provider's recommendations for weight gain. You may need to gain a little extra weight when you are pregnant with multiples. ? Eat healthy snacks often throughout the day. This will add calories and reduce nausea.  Drink enough fluid to keep your urine pale yellow.  Take prenatal vitamins. Ask your health care provider what vitamins are right for you. Activity Limit your activities by 20-24 weeks of pregnancy.  Rest often.  Avoid activities, exercise, and work that take a lot of effort.  Ask your health care provider when you should stop having sex. General instructions  Do not use any   products that contain nicotine or tobacco, such as cigarettes, e-cigarettes, and chewing tobacco. If you need help quitting, ask your health care provider.  Do not drink alcohol or use illegal drugs.  Take  over-the-counter and prescription medicines only as told by your health care provider.  Arrange for extra help around the house.  Keep all follow-up visits and all prenatal visits as told by your health care provider. This is important. Where to find more information  American College of Obstetricians and Gynecology: www.acog.org Contact a health care provider if:  You have dizziness.  You have nausea, vomiting, or diarrhea that does not go away.  You have depression or other emotions that are interfering with your normal activities.  You have a fever.  You have pain with urination.  You have a bad-smelling vaginal discharge.  You notice increased swelling in your face, hands, legs, or ankles. Get help right away if:  You have fluid leaking from your vagina.  You have bleeding from your vagina.  You have pelvic cramps, pelvic pressure, or nagging pain in your abdomen or lower back.  You are having regular contractions.  You have a severe headache, with or without changes in how you see.  You have chest pain or shortness of breath.  You notice that your babies move less often, or do not move at all. Summary  Having a multiple pregnancy means that a woman is carrying more than one baby at a time.  A multiple pregnancy puts you at a higher risk for delivering your babies before your due date, having diabetes, preeclampsia, too much blood loss after childbirth, or low birth weight of the babies.  Your health care provider will monitor you more closely during your pregnancy.  You may need to make some lifestyle changes during pregnancy. This includes eating more, limiting your activities after 20-24 weeks of pregnancy, and arranging for extra help around the house.  Follow up with your health care provider as instructed if you experience any complications. This information is not intended to replace advice given to you by your health care provider. Make sure you discuss  any questions you have with your health care provider. Document Revised: 05/21/2019 Document Reviewed: 05/21/2019 Elsevier Patient Education  2020 Elsevier Inc.  

## 2019-12-24 ENCOUNTER — Other Ambulatory Visit: Payer: Medicaid Other

## 2019-12-24 ENCOUNTER — Other Ambulatory Visit: Payer: Self-pay

## 2019-12-24 DIAGNOSIS — L299 Pruritus, unspecified: Secondary | ICD-10-CM

## 2019-12-25 ENCOUNTER — Ambulatory Visit: Payer: Medicaid Other | Attending: Internal Medicine

## 2019-12-25 DIAGNOSIS — Z20822 Contact with and (suspected) exposure to covid-19: Secondary | ICD-10-CM

## 2019-12-26 LAB — COMPREHENSIVE METABOLIC PANEL
ALT: 14 IU/L (ref 0–32)
AST: 27 IU/L (ref 0–40)
Albumin/Globulin Ratio: 1.4 (ref 1.2–2.2)
Albumin: 3.7 g/dL — ABNORMAL LOW (ref 3.9–5.0)
Alkaline Phosphatase: 132 IU/L — ABNORMAL HIGH (ref 39–117)
BUN/Creatinine Ratio: 17 (ref 9–23)
BUN: 11 mg/dL (ref 6–20)
Bilirubin Total: 0.2 mg/dL (ref 0.0–1.2)
CO2: 20 mmol/L (ref 20–29)
Calcium: 9 mg/dL (ref 8.7–10.2)
Chloride: 102 mmol/L (ref 96–106)
Creatinine, Ser: 0.65 mg/dL (ref 0.57–1.00)
GFR calc Af Amer: 148 mL/min/{1.73_m2} (ref >59)
GFR calc non Af Amer: 128 mL/min/{1.73_m2} (ref >59)
Globulin, Total: 2.6 g/dL (ref 1.5–4.5)
Glucose: 73 mg/dL (ref 65–99)
Potassium: 4.3 mmol/L (ref 3.5–5.2)
Sodium: 136 mmol/L (ref 134–144)
Total Protein: 6.3 g/dL (ref 6.0–8.5)

## 2019-12-26 LAB — BILE ACIDS, TOTAL: Bile Acids Total: 5.1 umol/L (ref 0.0–10.0)

## 2019-12-26 LAB — NOVEL CORONAVIRUS, NAA: SARS-CoV-2, NAA: NOT DETECTED

## 2019-12-28 ENCOUNTER — Other Ambulatory Visit: Payer: Self-pay | Admitting: Women's Health

## 2019-12-28 MED ORDER — PREDNISONE 20 MG PO TABS: 40.0000 mg | ORAL_TABLET | Freq: Every day | ORAL | 0 refills | Status: DC

## 2019-12-28 MED ORDER — LORATADINE 10 MG PO TABS: 10.0000 mg | ORAL_TABLET | Freq: Every day | ORAL | 1 refills | Status: DC

## 2020-01-03 ENCOUNTER — Other Ambulatory Visit: Payer: Self-pay | Admitting: Advanced Practice Midwife

## 2020-01-03 DIAGNOSIS — O30043 Twin pregnancy, dichorionic/diamniotic, third trimester: Secondary | ICD-10-CM

## 2020-01-04 ENCOUNTER — Encounter (HOSPITAL_COMMUNITY): Payer: Self-pay

## 2020-01-04 ENCOUNTER — Encounter: Payer: Self-pay | Admitting: Obstetrics & Gynecology

## 2020-01-04 ENCOUNTER — Ambulatory Visit (INDEPENDENT_AMBULATORY_CARE_PROVIDER_SITE_OTHER): Payer: Medicaid Other

## 2020-01-04 ENCOUNTER — Other Ambulatory Visit: Payer: Self-pay | Admitting: Obstetrics & Gynecology

## 2020-01-04 ENCOUNTER — Other Ambulatory Visit: Payer: Self-pay

## 2020-01-04 ENCOUNTER — Other Ambulatory Visit (HOSPITAL_COMMUNITY)
Admission: RE | Admit: 2020-01-04 | Discharge: 2020-01-04 | Disposition: A | Discharge status: Home / Self Care | Payer: Medicaid Other | Source: Ambulatory Visit | Attending: Obstetrics & Gynecology | Admitting: Obstetrics & Gynecology

## 2020-01-04 ENCOUNTER — Ambulatory Visit (INDEPENDENT_AMBULATORY_CARE_PROVIDER_SITE_OTHER): Payer: Medicaid Other | Admitting: Obstetrics & Gynecology

## 2020-01-04 ENCOUNTER — Other Ambulatory Visit: Payer: Self-pay | Admitting: Advanced Practice Midwife

## 2020-01-04 VITALS — BP 128/81 | HR 107 | Wt 148.0 lb

## 2020-01-04 DIAGNOSIS — O30043 Twin pregnancy, dichorionic/diamniotic, third trimester: Secondary | ICD-10-CM | POA: Diagnosis not present

## 2020-01-04 DIAGNOSIS — O321XX1 Maternal care for breech presentation, fetus 1: Secondary | ICD-10-CM | POA: Diagnosis not present

## 2020-01-04 DIAGNOSIS — Z3A36 36 weeks gestation of pregnancy: Secondary | ICD-10-CM

## 2020-01-04 DIAGNOSIS — Z113 Encounter for screening for infections with a predominantly sexual mode of transmission: Secondary | ICD-10-CM | POA: Diagnosis not present

## 2020-01-04 DIAGNOSIS — IMO0002 Reserved for concepts with insufficient information to code with codable children: Secondary | ICD-10-CM

## 2020-01-04 DIAGNOSIS — O365931 Maternal care for other known or suspected poor fetal growth, third trimester, fetus 1: Secondary | ICD-10-CM

## 2020-01-04 DIAGNOSIS — O0990 Supervision of high risk pregnancy, unspecified, unspecified trimester: Secondary | ICD-10-CM

## 2020-01-04 DIAGNOSIS — O365932 Maternal care for other known or suspected poor fetal growth, third trimester, fetus 2: Secondary | ICD-10-CM

## 2020-01-04 DIAGNOSIS — Z331 Pregnant state, incidental: Secondary | ICD-10-CM

## 2020-01-04 DIAGNOSIS — Z1389 Encounter for screening for other disorder: Secondary | ICD-10-CM

## 2020-01-04 LAB — POCT URINALYSIS DIPSTICK OB
Blood, UA: NEGATIVE
Glucose, UA: NEGATIVE
Ketones, UA: NEGATIVE
Leukocytes, UA: NEGATIVE
Nitrite, UA: NEGATIVE
POC,PROTEIN,UA: NEGATIVE

## 2020-01-04 NOTE — Patient Instructions (Signed)
Jasmine Pierce  01/04/2020   Your procedure is scheduled on:  01/09/2020  Arrive at 1215 at Entrance C on [address] at [facility]  and [employer]. You are invited to use the FREE valet parking or use the Visitor's parking deck.  Pick up the phone at the desk and dial [phone].  Call this number if you have problems the morning of surgery: [phone]  Remember:   Do not eat food:(After Midnight) Desps de medianoche.  Do not drink clear liquids: (After Midnight) Desps de medianoche.  Take these medicines the morning of surgery with A SIP OF WATER:  none   Do not wear jewelry, make-up or nail polish.  Do not wear lotions, powders, or perfumes. Do not wear deodorant.  Do not shave 48 hours prior to surgery.  Do not bring valuables to the hospital.  [facility] is not   responsible for any belongings or valuables brought to the hospital.  Contacts, dentures or bridgework may not be worn into surgery.  Leave suitcase in the car. After surgery it may be brought to your room.  For patients admitted to the hospital, checkout time is 11:00 AM the day of              discharge.      Please read over the following fact sheets that you were given:     Preparing for Surgery

## 2020-01-04 NOTE — Progress Notes (Signed)
   HIGH-RISK PREGNANCY VISIT Patient name: Jasmine Pierce MRN [id number]  Date of birth: 05/10/2001 Chief Complaint:   Routine Prenatal Visit (Ultrasound)  History of Present Illness:   Tracy Charlann Noss is a 19 y.o. G78P0010 female at [redacted]w[redacted]d with an Estimated Date of Delivery: 02/01/20 being seen today for ongoing management of a high-risk pregnancy complicated by DiDi twins.  Today she reports no complaints. Contractions: Irritability. Vag. Bleeding: None.  Movement: Present. denies leaking of fluid.  Review of Systems:   Pertinent items are noted in HPI Denies abnormal vaginal discharge w/ itching/odor/irritation, headaches, visual changes, shortness of breath, chest pain, abdominal pain, severe nausea/vomiting, or problems with urination or bowel movements unless otherwise stated above. Pertinent History Reviewed:  Reviewed past medical,surgical, social, obstetrical and family history.  Reviewed problem list, medications and allergies. Physical Assessment:   Vitals:   01/04/20 1207  BP: 128/81  Pulse: (!) 107  Weight: 148 lb (67.1 kg)  Body mass index is 26.22 kg/m.           Physical Examination:   General appearance: alert, well appearing, and in no distress  Mental status: alert, oriented to person, place, and time  Skin: warm & dry   Extremities: Edema: None    Cardiovascular: normal heart rate noted  Respiratory: normal respiratory effort, no distress  Abdomen: gravid, soft, non-tender  Pelvic: Cervical exam performed         Fetal Status:     Movement: Present    Fetal Surveillance Testing today: BPP 8/8 x 2 elevated Dopplers   Chaperone: Stoney Bang    Results for orders placed or performed in visit on 01/04/20 (from the past 24 hour(s))  POC Urinalysis Dipstick OB   Collection Time: 01/04/20 12:06 PM  Result Value Ref Range   Color, UA     Clarity, UA     Glucose, UA Negative Negative   Bilirubin, UA     Ketones, UA n    Spec Grav, UA     Blood, UA n    pH,  UA     POC,PROTEIN,UA Negative Negative, Trace, Small (1+), Moderate (2+), Large (3+), 4+   Urobilinogen, UA     Nitrite, UA n    Leukocytes, UA Negative Negative   Appearance     Odor      Assessment & Plan:  1) High-risk pregnancy G2P0010 at [redacted]w[redacted]d with an Estimated Date of Delivery: 02/01/20   2) DiDi twins, stable, Twin A breech, Twin B vertex with FGR, both twins with elevated Dopplers 89/93% respectively with both twins BPP 8/8    Meds: No orders of the defined types were placed in this encounter.   Labs/procedures today: sonogram  Treatment Plan:  Primary C section 01/09/20 [redacted]w[redacted]d   Reviewed: Term labor symptoms and general obstetric precautions including but not limited to vaginal bleeding, contractions, leaking of fluid and fetal movement were reviewed in detail with the patient.  All questions were answered. Has home bp cuff. Rx faxed to  Check bp weekly, let us know if >140/90.   Follow-up: Return in about 18 days (around 01/22/2020) for Post Op, with Dr Despina Hidden.  Orders Placed This Encounter  Procedures  . Strep Gp B NAA  . POC Urinalysis Dipstick OB   Lazaro Arms  01/04/2020 12:37 PM

## 2020-01-04 NOTE — Progress Notes (Signed)
[place] DI/DI TWINS 36 wks,normal ovaries BABY A: complete breech,anterior placenta gr 3,svp of fluid 5.1 cm,fhr 130 bpm,RI .63,.72,.60=89% w/EDF,EFW 2624 g 31% BABY B: cephalic right,anterior placenta gr 3,svp of fluid 4.4 cm,fhr 142 bpm,RI .70,.69=93% w/EDF,EFW 2288 g 8%,discordance 12.8%

## 2020-01-06 LAB — STREP GP B NAA: Strep Gp B NAA: POSITIVE — AB

## 2020-01-07 ENCOUNTER — Other Ambulatory Visit (HOSPITAL_COMMUNITY)
Admission: RE | Admit: 2020-01-07 | Discharge: 2020-01-07 | Disposition: A | Discharge status: Home / Self Care | Payer: Medicaid Other | Source: Ambulatory Visit | Attending: Obstetrics & Gynecology | Admitting: Obstetrics & Gynecology

## 2020-01-07 ENCOUNTER — Other Ambulatory Visit: Payer: Self-pay | Admitting: Obstetrics & Gynecology

## 2020-01-07 ENCOUNTER — Other Ambulatory Visit: Payer: Self-pay

## 2020-01-07 DIAGNOSIS — Z20822 Contact with and (suspected) exposure to covid-19: Secondary | ICD-10-CM | POA: Diagnosis not present

## 2020-01-07 DIAGNOSIS — Z01812 Encounter for preprocedural laboratory examination: Secondary | ICD-10-CM | POA: Diagnosis not present

## 2020-01-07 LAB — CERVICOVAGINAL ANCILLARY ONLY
Chlamydia: NEGATIVE
Comment: NEGATIVE
Comment: NORMAL
Neisseria Gonorrhea: NEGATIVE

## 2020-01-07 LAB — CBC
HCT: 39 % (ref 36.0–46.0)
Hemoglobin: 13.3 g/dL (ref 12.0–15.0)
MCH: 29.6 pg (ref 26.0–34.0)
MCHC: 34.1 g/dL (ref 30.0–36.0)
MCV: 86.9 fL (ref 80.0–100.0)
Platelets: 213 10*3/uL (ref 150–400)
RBC: 4.49 MIL/uL (ref 3.87–5.11)
RDW: 13.1 % (ref 11.5–15.5)
WBC: 12.2 10*3/uL — ABNORMAL HIGH (ref 4.0–10.5)
nRBC: 0 % (ref 0.0–0.2)

## 2020-01-07 LAB — RPR: RPR Ser Ql: NONREACTIVE

## 2020-01-07 LAB — TYPE AND SCREEN
ABO/RH(D): A POS
Antibody Screen: NEGATIVE

## 2020-01-07 LAB — ABO/RH: ABO/RH(D): A POS

## 2020-01-07 LAB — SARS CORONAVIRUS 2 (TAT 6-24 HRS): SARS Coronavirus 2: NEGATIVE

## 2020-01-07 NOTE — Anesthesia Preprocedure Evaluation (Addendum)
Anesthesia Evaluation  Patient identified by MRN, date of birth, ID band Patient awake    Reviewed: Allergy & Precautions, NPO status , Patient's Chart, lab work & pertinent test results  Airway Mallampati: II  TM Distance: >3 FB Neck ROM: Full    Dental no notable dental hx. (+) Teeth Intact, Dental Advisory Given   Pulmonary asthma , former smoker,    Pulmonary exam normal breath sounds clear to auscultation       Cardiovascular negative cardio ROS Normal cardiovascular exam Rhythm:Regular Rate:Normal     Neuro/Psych negative neurological ROS  negative psych ROS   GI/Hepatic Neg liver ROS, GERD  Medicated and Controlled,  Endo/Other  negative endocrine ROS  Renal/GU negative Renal ROS  negative genitourinary   Musculoskeletal negative musculoskeletal ROS (+)   Abdominal Normal abdominal exam  (+)   Peds  Hematology negative hematology ROS (+) hct 39, plt 213   Anesthesia Other Findings Day of surgery medications reviewed with the patient.  Reproductive/Obstetrics (+) Pregnancy twins                            Anesthesia Physical Anesthesia Plan  ASA: II  Anesthesia Plan: Spinal   Post-op Pain Management:    Induction:   PONV Risk Score and Plan: 3 and Ondansetron, Dexamethasone and Treatment may vary due to age or medical condition  Airway Management Planned: Natural Airway and Nasal Cannula  Additional Equipment: None  Intra-op Plan:   Post-operative Plan:   Informed Consent: I have reviewed the patients History and Physical, chart, labs and discussed the procedure including the risks, benefits and alternatives for the proposed anesthesia with the patient or authorized representative who has indicated his/her understanding and acceptance.       Plan Discussed with: CRNA  Anesthesia Plan Comments:         Anesthesia Quick Evaluation

## 2020-01-07 NOTE — MAU Note (Signed)
Covid swab collected. Pt tolerated well. Asymptomatic. Lab called to draw CBC/RPR/ and Type and screen

## 2020-01-08 ENCOUNTER — Encounter (HOSPITAL_COMMUNITY): Payer: Self-pay | Admitting: Obstetrics & Gynecology

## 2020-01-09 ENCOUNTER — Encounter (HOSPITAL_COMMUNITY)
Admission: RE | Disposition: A | Discharge status: Home / Self Care | Payer: Self-pay | Source: Home / Self Care | Attending: Obstetrics & Gynecology

## 2020-01-09 ENCOUNTER — Inpatient Hospital Stay (HOSPITAL_COMMUNITY): Payer: Medicaid Other | Admitting: Anesthesiology

## 2020-01-09 ENCOUNTER — Inpatient Hospital Stay (HOSPITAL_COMMUNITY)
Admission: RE | Admit: 2020-01-09 | Discharge: 2020-01-12 | DRG: 788 | Disposition: A | Discharge status: Home / Self Care | Payer: Medicaid Other | Attending: Obstetrics & Gynecology | Admitting: Obstetrics & Gynecology

## 2020-01-09 ENCOUNTER — Encounter (HOSPITAL_COMMUNITY): Payer: Self-pay | Admitting: Obstetrics & Gynecology

## 2020-01-09 DIAGNOSIS — O365932 Maternal care for other known or suspected poor fetal growth, third trimester, fetus 2: Secondary | ICD-10-CM | POA: Diagnosis not present

## 2020-01-09 DIAGNOSIS — O30049 Twin pregnancy, dichorionic/diamniotic, unspecified trimester: Secondary | ICD-10-CM

## 2020-01-09 DIAGNOSIS — Z98891 History of uterine scar from previous surgery: Secondary | ICD-10-CM

## 2020-01-09 DIAGNOSIS — O321XX1 Maternal care for breech presentation, fetus 1: Secondary | ICD-10-CM | POA: Diagnosis not present

## 2020-01-09 DIAGNOSIS — Z87891 Personal history of nicotine dependence: Secondary | ICD-10-CM | POA: Diagnosis not present

## 2020-01-09 DIAGNOSIS — O30043 Twin pregnancy, dichorionic/diamniotic, third trimester: Secondary | ICD-10-CM | POA: Diagnosis not present

## 2020-01-09 DIAGNOSIS — Z3A36 36 weeks gestation of pregnancy: Secondary | ICD-10-CM | POA: Diagnosis not present

## 2020-01-09 SURGERY — Surgical Case
Anesthesia: Spinal

## 2020-01-09 MED ORDER — METHYLERGONOVINE MALEATE 0.2 MG PO TABS: 0.2000 mg | ORAL_TABLET | ORAL | Status: DC | PRN

## 2020-01-09 MED ORDER — NALOXONE HCL 4 MG/10ML IJ SOLN
1.0000 ug/kg/h | INTRAVENOUS | Status: DC | PRN
  Filled 2020-01-09: qty 5

## 2020-01-09 MED ORDER — KETOROLAC TROMETHAMINE 30 MG/ML IJ SOLN
30.0000 mg | Freq: Four times a day (QID) | INTRAMUSCULAR | Status: AC | PRN
  Administered 2020-01-09: 16:00:00 30 mg via INTRAMUSCULAR

## 2020-01-09 MED ORDER — OXYCODONE HCL 5 MG/5ML PO SOLN: 5.0000 mg | Freq: Once | ORAL | Status: DC | PRN

## 2020-01-09 MED ORDER — NALBUPHINE HCL 10 MG/ML IJ SOLN: 5.0000 mg | INTRAMUSCULAR | Status: DC | PRN

## 2020-01-09 MED ORDER — KETOROLAC TROMETHAMINE 30 MG/ML IJ SOLN: 30.0000 mg | Freq: Four times a day (QID) | INTRAMUSCULAR | Status: AC | PRN

## 2020-01-09 MED ORDER — PRENATAL PLUS 27-1 MG PO TABS: 1.0000 | ORAL_TABLET | Freq: Every day | ORAL | Status: DC

## 2020-01-09 MED ORDER — SODIUM CHLORIDE 0.9% FLUSH: 3.0000 mL | INTRAVENOUS | Status: DC | PRN

## 2020-01-09 MED ORDER — ONDANSETRON HCL 4 MG/2ML IJ SOLN
INTRAMUSCULAR | Status: DC | PRN
  Administered 2020-01-09: 4 mg via INTRAVENOUS

## 2020-01-09 MED ORDER — HYDROMORPHONE HCL 1 MG/ML IJ SOLN: 0.2500 mg | INTRAMUSCULAR | Status: DC | PRN

## 2020-01-09 MED ORDER — OXYTOCIN 40 UNITS IN NORMAL SALINE INFUSION - SIMPLE MED
INTRAVENOUS | Status: AC
  Filled 2020-01-09: qty 1000

## 2020-01-09 MED ORDER — SENNOSIDES-DOCUSATE SODIUM 8.6-50 MG PO TABS
2.0000 | ORAL_TABLET | ORAL | Status: DC
  Administered 2020-01-09 – 2020-01-11 (×3): 2 via ORAL
  Filled 2020-01-09 (×3): qty 2

## 2020-01-09 MED ORDER — STERILE WATER FOR IRRIGATION IR SOLN
Status: DC | PRN
  Administered 2020-01-09: 1

## 2020-01-09 MED ORDER — SCOPOLAMINE 1 MG/3DAYS TD PT72
MEDICATED_PATCH | TRANSDERMAL | Status: AC
  Filled 2020-01-09: qty 1

## 2020-01-09 MED ORDER — NALOXONE HCL 0.4 MG/ML IJ SOLN: 0.4000 mg | INTRAMUSCULAR | Status: DC | PRN

## 2020-01-09 MED ORDER — KETOROLAC TROMETHAMINE 30 MG/ML IJ SOLN
30.0000 mg | Freq: Four times a day (QID) | INTRAMUSCULAR | Status: AC
  Administered 2020-01-09 – 2020-01-10 (×3): 30 mg via INTRAVENOUS
  Filled 2020-01-09 (×3): qty 1

## 2020-01-09 MED ORDER — ONDANSETRON HCL 4 MG/2ML IJ SOLN: 4.0000 mg | Freq: Three times a day (TID) | INTRAMUSCULAR | Status: DC | PRN

## 2020-01-09 MED ORDER — PRENATAL MULTIVITAMIN CH
1.0000 | ORAL_TABLET | Freq: Every day | ORAL | Status: DC
  Administered 2020-01-10 – 2020-01-12 (×3): 1 via ORAL
  Filled 2020-01-09 (×3): qty 1

## 2020-01-09 MED ORDER — MEPERIDINE HCL 25 MG/ML IJ SOLN: 6.2500 mg | INTRAMUSCULAR | Status: DC | PRN

## 2020-01-09 MED ORDER — SODIUM CHLORIDE 0.9 % IV SOLN: INTRAVENOUS | Status: DC | PRN

## 2020-01-09 MED ORDER — OXYCODONE HCL 5 MG PO TABS
5.0000 mg | ORAL_TABLET | ORAL | Status: DC | PRN
  Administered 2020-01-11: 5 mg via ORAL
  Filled 2020-01-09: qty 1

## 2020-01-09 MED ORDER — MORPHINE SULFATE (PF) 0.5 MG/ML IJ SOLN
INTRAMUSCULAR | Status: AC
  Filled 2020-01-09: qty 10

## 2020-01-09 MED ORDER — DEXAMETHASONE SODIUM PHOSPHATE 10 MG/ML IJ SOLN
INTRAMUSCULAR | Status: DC | PRN
  Administered 2020-01-09: 10 mg via INTRAVENOUS

## 2020-01-09 MED ORDER — FENTANYL CITRATE (PF) 100 MCG/2ML IJ SOLN
INTRAMUSCULAR | Status: DC | PRN
  Administered 2020-01-09: 15 ug via INTRATHECAL

## 2020-01-09 MED ORDER — MORPHINE SULFATE (PF) 0.5 MG/ML IJ SOLN
INTRAMUSCULAR | Status: DC | PRN
  Administered 2020-01-09: .15 mg via INTRATHECAL

## 2020-01-09 MED ORDER — CEFAZOLIN SODIUM-DEXTROSE 2-4 GM/100ML-% IV SOLN
INTRAVENOUS | Status: AC
  Filled 2020-01-09: qty 100

## 2020-01-09 MED ORDER — DIBUCAINE (PERIANAL) 1 % EX OINT: 1.0000 "application " | TOPICAL_OINTMENT | CUTANEOUS | Status: DC | PRN

## 2020-01-09 MED ORDER — DEXAMETHASONE SODIUM PHOSPHATE 10 MG/ML IJ SOLN
INTRAMUSCULAR | Status: AC
  Filled 2020-01-09: qty 1

## 2020-01-09 MED ORDER — SIMETHICONE 80 MG PO CHEW
80.0000 mg | CHEWABLE_TABLET | ORAL | Status: DC
  Administered 2020-01-09 – 2020-01-11 (×3): 80 mg via ORAL
  Filled 2020-01-09 (×3): qty 1

## 2020-01-09 MED ORDER — LACTATED RINGERS IV SOLN: INTRAVENOUS | Status: DC

## 2020-01-09 MED ORDER — METHYLERGONOVINE MALEATE 0.2 MG/ML IJ SOLN: 0.2000 mg | INTRAMUSCULAR | Status: DC | PRN

## 2020-01-09 MED ORDER — OXYTOCIN 40 UNITS IN NORMAL SALINE INFUSION - SIMPLE MED: 2.5000 [IU]/h | INTRAVENOUS | Status: AC

## 2020-01-09 MED ORDER — TETANUS-DIPHTH-ACELL PERTUSSIS 5-2.5-18.5 LF-MCG/0.5 IM SUSP
0.5000 mL | Freq: Once | INTRAMUSCULAR | Status: AC
  Administered 2020-01-12: 0.5 mL via INTRAMUSCULAR
  Filled 2020-01-09: qty 0.5

## 2020-01-09 MED ORDER — PHENYLEPHRINE HCL-NACL 20-0.9 MG/250ML-% IV SOLN
INTRAVENOUS | Status: AC
  Filled 2020-01-09: qty 250

## 2020-01-09 MED ORDER — DIPHENHYDRAMINE HCL 25 MG PO CAPS: 25.0000 mg | ORAL_CAPSULE | ORAL | Status: DC | PRN

## 2020-01-09 MED ORDER — NALBUPHINE HCL 10 MG/ML IJ SOLN: 5.0000 mg | Freq: Once | INTRAMUSCULAR | Status: DC | PRN

## 2020-01-09 MED ORDER — KETOROLAC TROMETHAMINE 30 MG/ML IJ SOLN
INTRAMUSCULAR | Status: AC
  Filled 2020-01-09: qty 1

## 2020-01-09 MED ORDER — DIPHENHYDRAMINE HCL 50 MG/ML IJ SOLN
INTRAMUSCULAR | Status: AC
  Filled 2020-01-09: qty 1

## 2020-01-09 MED ORDER — CEFAZOLIN SODIUM-DEXTROSE 2-3 GM-%(50ML) IV SOLR
INTRAVENOUS | Status: DC | PRN
  Administered 2020-01-09: 2 g via INTRAVENOUS

## 2020-01-09 MED ORDER — MENTHOL 3 MG MT LOZG: 1.0000 | LOZENGE | OROMUCOSAL | Status: DC | PRN

## 2020-01-09 MED ORDER — OXYCODONE HCL 5 MG PO TABS: 5.0000 mg | ORAL_TABLET | Freq: Once | ORAL | Status: DC | PRN

## 2020-01-09 MED ORDER — GABAPENTIN 100 MG PO CAPS
100.0000 mg | ORAL_CAPSULE | Freq: Three times a day (TID) | ORAL | Status: DC
  Administered 2020-01-09 – 2020-01-12 (×8): 100 mg via ORAL
  Filled 2020-01-09 (×8): qty 1

## 2020-01-09 MED ORDER — DIPHENHYDRAMINE HCL 50 MG/ML IJ SOLN
12.5000 mg | INTRAMUSCULAR | Status: DC | PRN
  Administered 2020-01-09: 12.5 mg via INTRAVENOUS

## 2020-01-09 MED ORDER — SCOPOLAMINE 1 MG/3DAYS TD PT72
1.0000 | MEDICATED_PATCH | Freq: Once | TRANSDERMAL | Status: AC
  Administered 2020-01-09: 15:00:00 1.5 mg via TRANSDERMAL

## 2020-01-09 MED ORDER — OXYTOCIN 40 UNITS IN NORMAL SALINE INFUSION - SIMPLE MED
INTRAVENOUS | Status: DC | PRN
  Administered 2020-01-09: 40 [IU] via INTRAVENOUS

## 2020-01-09 MED ORDER — BUPIVACAINE IN DEXTROSE 0.75-8.25 % IT SOLN
INTRATHECAL | Status: DC | PRN
  Administered 2020-01-09: 1.5 mL via INTRATHECAL

## 2020-01-09 MED ORDER — ZOLPIDEM TARTRATE 5 MG PO TABS: 5.0000 mg | ORAL_TABLET | Freq: Every evening | ORAL | Status: DC | PRN

## 2020-01-09 MED ORDER — COCONUT OIL OIL
1.0000 "application " | TOPICAL_OIL | Status: DC | PRN
  Administered 2020-01-09: 1 via TOPICAL

## 2020-01-09 MED ORDER — SIMETHICONE 80 MG PO CHEW
80.0000 mg | CHEWABLE_TABLET | Freq: Three times a day (TID) | ORAL | Status: DC
  Administered 2020-01-09 – 2020-01-12 (×7): 80 mg via ORAL
  Filled 2020-01-09 (×7): qty 1

## 2020-01-09 MED ORDER — IBUPROFEN 800 MG PO TABS
800.0000 mg | ORAL_TABLET | Freq: Four times a day (QID) | ORAL | Status: DC
  Administered 2020-01-10 – 2020-01-12 (×8): 800 mg via ORAL
  Filled 2020-01-09 (×8): qty 1

## 2020-01-09 MED ORDER — PROMETHAZINE HCL 25 MG/ML IJ SOLN: 6.2500 mg | INTRAMUSCULAR | Status: DC | PRN

## 2020-01-09 MED ORDER — ONDANSETRON HCL 4 MG/2ML IJ SOLN
INTRAMUSCULAR | Status: AC
  Filled 2020-01-09: qty 2

## 2020-01-09 MED ORDER — LACTATED RINGERS IV SOLN: INTRAVENOUS | Status: DC | PRN

## 2020-01-09 MED ORDER — SIMETHICONE 80 MG PO CHEW
80.0000 mg | CHEWABLE_TABLET | ORAL | Status: DC | PRN
  Filled 2020-01-09: qty 1

## 2020-01-09 MED ORDER — FENTANYL CITRATE (PF) 100 MCG/2ML IJ SOLN
INTRAMUSCULAR | Status: AC
  Filled 2020-01-09: qty 2

## 2020-01-09 MED ORDER — WITCH HAZEL-GLYCERIN EX PADS: 1.0000 "application " | MEDICATED_PAD | CUTANEOUS | Status: DC | PRN

## 2020-01-09 MED ORDER — KETOROLAC TROMETHAMINE 30 MG/ML IJ SOLN: 30.0000 mg | Freq: Once | INTRAMUSCULAR | Status: DC | PRN

## 2020-01-09 MED ORDER — ENOXAPARIN SODIUM 40 MG/0.4ML ~~LOC~~ SOLN
40.0000 mg | SUBCUTANEOUS | Status: DC
  Administered 2020-01-10 – 2020-01-12 (×3): 40 mg via SUBCUTANEOUS
  Filled 2020-01-09 (×3): qty 0.4

## 2020-01-09 MED ORDER — CEFAZOLIN SODIUM-DEXTROSE 2-4 GM/100ML-% IV SOLN: 2.0000 g | INTRAVENOUS | Status: DC

## 2020-01-09 MED ORDER — DIPHENHYDRAMINE HCL 25 MG PO CAPS: 25.0000 mg | ORAL_CAPSULE | Freq: Four times a day (QID) | ORAL | Status: DC | PRN

## 2020-01-09 MED ORDER — SODIUM CHLORIDE 0.9 % IR SOLN
Status: DC | PRN
  Administered 2020-01-09: 1

## 2020-01-09 MED ORDER — PHENYLEPHRINE HCL-NACL 20-0.9 MG/250ML-% IV SOLN
INTRAVENOUS | Status: DC | PRN
  Administered 2020-01-09: 50 ug/min via INTRAVENOUS

## 2020-01-09 MED ORDER — ACETAMINOPHEN 500 MG PO TABS
1000.0000 mg | ORAL_TABLET | Freq: Four times a day (QID) | ORAL | Status: AC
  Administered 2020-01-09 – 2020-01-10 (×4): 1000 mg via ORAL
  Filled 2020-01-09 (×4): qty 2

## 2020-01-09 SURGICAL SUPPLY — 37 items
CHLORAPREP W/TINT 26ML (MISCELLANEOUS) ×3 IMPLANT
CLAMP CORD UMBIL (MISCELLANEOUS) IMPLANT
CLOTH BEACON ORANGE TIMEOUT ST (SAFETY) ×3 IMPLANT
DERMABOND ADVANCED (GAUZE/BANDAGES/DRESSINGS) ×2
DERMABOND ADVANCED .7 DNX12 (GAUZE/BANDAGES/DRESSINGS) ×1 IMPLANT
DRSG OPSITE POSTOP 4X10 (GAUZE/BANDAGES/DRESSINGS) ×3 IMPLANT
ELECT REM PT RETURN 9FT ADLT (ELECTROSURGICAL) ×3
ELECTRODE REM PT RTRN 9FT ADLT (ELECTROSURGICAL) ×1 IMPLANT
EXTRACTOR VACUUM BELL STYLE (SUCTIONS) IMPLANT
GLOVE BIOGEL PI IND STRL 7.0 (GLOVE) ×1 IMPLANT
GLOVE BIOGEL PI IND STRL 8 (GLOVE) ×1 IMPLANT
GLOVE BIOGEL PI INDICATOR 7.0 (GLOVE) ×2
GLOVE BIOGEL PI INDICATOR 8 (GLOVE) ×2
GLOVE ECLIPSE 8.0 STRL XLNG CF (GLOVE) ×3 IMPLANT
GOWN STRL REUS W/TWL LRG LVL3 (GOWN DISPOSABLE) ×6 IMPLANT
KIT ABG SYR 3ML LUER SLIP (SYRINGE) ×3 IMPLANT
NEEDLE HYPO 18GX1.5 BLUNT FILL (NEEDLE) ×3 IMPLANT
NEEDLE HYPO 22GX1.5 SAFETY (NEEDLE) ×3 IMPLANT
NEEDLE HYPO 25X5/8 SAFETYGLIDE (NEEDLE) ×3 IMPLANT
NS IRRIG 1000ML POUR BTL (IV SOLUTION) ×3 IMPLANT
PACK C SECTION WH (CUSTOM PROCEDURE TRAY) ×3 IMPLANT
PAD OB MATERNITY 4.3X12.25 (PERSONAL CARE ITEMS) ×3 IMPLANT
PENCIL SMOKE EVAC W/HOLSTER (ELECTROSURGICAL) ×3 IMPLANT
RTRCTR C-SECT PINK 25CM LRG (MISCELLANEOUS) IMPLANT
SUT CHROMIC 0 CT 1 (SUTURE) ×3 IMPLANT
SUT MNCRL 0 VIOLET CTX 36 (SUTURE) ×4 IMPLANT
SUT MONOCRYL 0 CTX 36 (SUTURE) ×12
SUT PLAIN 2 0 (SUTURE)
SUT PLAIN 2 0 XLH (SUTURE) IMPLANT
SUT PLAIN ABS 2-0 CT1 27XMFL (SUTURE) IMPLANT
SUT VIC AB 0 CTX 36 (SUTURE) ×3
SUT VIC AB 0 CTX36XBRD ANBCTRL (SUTURE) ×1 IMPLANT
SUT VIC AB 4-0 KS 27 (SUTURE) IMPLANT
SYR 20CC LL (SYRINGE) ×6 IMPLANT
TOWEL OR 17X24 6PK STRL BLUE (TOWEL DISPOSABLE) ×3 IMPLANT
TRAY FOLEY W/BAG SLVR 14FR LF (SET/KITS/TRAYS/PACK) IMPLANT
WATER STERILE IRR 1000ML POUR (IV SOLUTION) ×3 IMPLANT

## 2020-01-09 NOTE — H&P (Signed)
Jasmine Pierce is a 19 y.o. female G2P0010 Estimated Date of Delivery: 02/01/20 [redacted]w[redacted]d presenting for primary Caesarean section. DiDi twins. Twin A is breech Twin B with FGR and elevated Dopplers of both twins 89/93%. OB History    Gravida  2   Para  0   Term  0   Preterm  0   AB  1   Living  0     SAB  1   TAB  0   Ectopic  0   Multiple  0   Live Births  0          Past Medical History:  Diagnosis Date  . Asthma   . Miscarriage   . Ovarian cyst    ruptured in 2016   Past Surgical History:  Procedure Laterality Date  . NO PAST SURGERIES     Family History: family history includes Asthma in her maternal aunt; Cancer in an other family member; Chronic Renal Failure in an other family member; Colon cancer in an other family member; Diabetes in her paternal grandfather and paternal grandmother; Berenice Primas' disease in her father; Hypertension in her maternal grandmother, paternal grandfather, and paternal grandmother; Sickle cell trait in her mother. Social History:  reports that she has quit smoking. Her smoking use included cigarettes. She has never used smokeless tobacco. She reports that she does not drink alcohol or use drugs.      Maternal Diabetes: No Genetic Screening: Normal Maternal Ultrasounds/Referrals: Normal Fetal Ultrasounds or other Referrals:  None Maternal Substance Abuse:  No Significant Maternal Medications:  None Significant Maternal Lab Results:  None Other Comments:    Review of Systems   Review of Systems  Constitutional: Negative for fever, chills, weight loss, malaise/fatigue and diaphoresis.  HENT: Negative for hearing loss, ear pain, nosebleeds, congestion, sore throat, neck pain, tinnitus and ear discharge.   Eyes: Negative for blurred vision, double vision, photophobia, pain, discharge and redness.  Respiratory: Negative for cough, hemoptysis, sputum production, shortness of breath, wheezing and stridor.   Cardiovascular: Negative for  chest pain, palpitations, orthopnea, claudication, leg swelling and PND.  Gastrointestinal: negative for abdominal pain. Negative for heartburn, nausea, vomiting, diarrhea, constipation, blood in stool and melena.  Genitourinary: Negative for dysuria, urgency, frequency, hematuria and flank pain.  Musculoskeletal: Negative for myalgias, back pain, joint pain and falls.  Skin: Negative for itching and rash.  Neurological: Negative for dizziness, tingling, tremors, sensory change, speech change, focal weakness, seizures, loss of consciousness, weakness and headaches.  Endo/Heme/Allergies: Negative for environmental allergies and polydipsia. Does not bruise/bleed easily.  Psychiatric/Behavioral: Negative for depression, suicidal ideas, hallucinations, memory loss and substance abuse. The patient is not nervous/anxious and does not have insomnia.      History  Past Medical History:  Diagnosis Date  . Asthma   . Miscarriage   . Ovarian cyst    ruptured in 2016    Past Surgical History:  Procedure Laterality Date  . NO PAST SURGERIES      OB History    Gravida  2   Para  0   Term  0   Preterm  0   AB  1   Living  0     SAB  1   TAB  0   Ectopic  0   Multiple  0   Live Births  0           No Known Allergies  Social History   Socioeconomic History  . Marital status: Single  Spouse name: Not on file  . Number of children: Not on file  . Years of education: Not on file  . Highest education level: High school graduate  Occupational History  . Occupation: [employer] and [employer]  Tobacco Use  . Smoking status: Former Smoker    Types: Cigarettes  . Smokeless tobacco: Never Used  . Tobacco comment: Quit "months ago"   Substance and Sexual Activity  . Alcohol use: No  . Drug use: No  . Sexual activity: Yes    Birth control/protection: None  Other Topics Concern  . Not on file  Social History Narrative  . Not on file   Social Determinants of Health    Financial Resource Strain: Low Risk   . Difficulty of Paying Living Expenses: Not hard at all  Food Insecurity: No Food Insecurity  . Worried About Programme researcher, broadcasting/film/video in the Last Year: Never true  . Ran Out of Food in the Last Year: Never true  Transportation Needs: No Transportation Needs  . Lack of Transportation (Medical): No  . Lack of Transportation (Non-Medical): No  Physical Activity: Inactive  . Days of Exercise per Week: 0 days  . Minutes of Exercise per Session: 0 min  Stress: No Stress Concern Present  . Feeling of Stress : Not at all  Social Connections: Somewhat Isolated  . Frequency of Communication with Friends and Family: More than three times a week  . Frequency of Social Gatherings with Friends and Family: Once a week  . Attends Religious Services: Never  . Active Member of Clubs or Organizations: No  . Attends Banker Meetings: Never  . Marital Status: Living with partner    Family History  Problem Relation Age of Onset  . Hypertension Paternal Grandfather   . Diabetes Paternal Grandfather   . Diabetes Paternal Grandmother   . Hypertension Paternal Grandmother   . Hypertension Maternal Grandmother   . Graves' disease Father   . Sickle cell trait Mother   . Chronic Renal Failure Other   . Cancer Other        cervical  . Colon cancer Other   . Asthma Maternal Aunt       Blood pressure 115/68, pulse 79, temperature 98.4 F (36.9 C), temperature source Oral, resp. rate 16, last menstrual period 04/27/2019. Exam Physical Exam  Physical Exam  Vitals reviewed. Constitutional: She is oriented to person, place, and time. She appears well-developed and well-nourished.  HENT:  Head: Normocephalic and atraumatic.  Right Ear: External ear normal.  Left Ear: External ear normal.  Nose: Nose normal.  Mouth/Throat: Oropharynx is clear and moist.  Eyes: Conjunctivae and EOM are normal. Pupils are equal, round, and reactive to light. Right eye  exhibits no discharge. Left eye exhibits no discharge. No scleral icterus.  Neck: Normal range of motion. Neck supple. No tracheal deviation present. No thyromegaly present.  Cardiovascular: Normal rate, regular rhythm, normal heart sounds and intact distal pulses.  Exam reveals no gallop and no friction rub.   No murmur heard. Respiratory: Effort normal and breath sounds normal. No respiratory distress. She has no wheezes. She has no rales. She exhibits no tenderness.  GI: Soft. Bowel sounds are normal. She exhibits no distension and no mass. There is tenderness. There is no rebound and no guarding.  Genitourinary:       Vulva is normal without lesions Vagina is pink moist without discharge Cervix normal in appearance and pap is normal Uterus is size consistent  with 40 weeks Adnexa is negative with normal sized ovaries by sonogram  Musculoskeletal: Normal range of motion. She exhibits no edema and no tenderness.  Neurological: She is alert and oriented to person, place, and time. She has normal reflexes. She displays normal reflexes. No cranial nerve deficit. She exhibits normal muscle tone. Coordination normal.  Skin: Skin is warm and dry. No rash noted. No erythema. No pallor.  Psychiatric: She has a normal mood and affect. Her behavior is normal. Judgment and thought content normal.    Prenatal labs: ABO, Rh: --/--/A POS, A POS Performed at [facility], [address], [place], [place] [address]  [phone]) Antibody: NEG (03/29 3762) Rubella: 3.49 (10/16 1338) RPR: NON REACTIVE (03/29 0830)  HBsAg: Negative (10/16 1338)  HIV: Non Reactive (01/29 0855)  GBS: Positive/-- (03/26 1400)   Assessment/Plan: [redacted]w[redacted]d Estimated Date of Delivery: 02/01/20  DiDi twins Twin A Breech  Twin B FGR Both twins with elevated Doppler flows  Primary Caesarean section   Lazaro Arms 01/09/2020, 1:11 PM

## 2020-01-09 NOTE — Lactation Note (Signed)
This note was copied from a baby's chart. Lactation Consultation Note  Patient Name: [name] [id number] Date: 01/09/2020   [facility] referral form was successfully sent to [facility] Main office at [facility].  Maternal Data    Feeding Feeding Type: Breast Fed  LATCH Score Latch: Grasps breast easily, tongue down, lips flanged, rhythmical sucking.  Audible Swallowing: Spontaneous and intermittent  Type of Nipple: Everted at rest and after stimulation  Comfort (Breast/Nipple): Soft / non-tender  Hold (Positioning): Full assist, staff holds infant at breast  [facility] Score: 8  Interventions    Lactation Tools Discussed/Used     Consult Status      Camya Haydon Venetia Constable 01/09/2020, 11:32 PM

## 2020-01-09 NOTE — Anesthesia Postprocedure Evaluation (Signed)
Anesthesia Post Note  Patient: Jasmine Pierce  Procedure(s) Performed: CESAREAN SECTION MULTI-GESTATIONAL (N/A )     Patient location during evaluation: PACU Anesthesia Type: Spinal Level of consciousness: awake and alert and oriented Pain management: pain level controlled Vital Signs Assessment: post-procedure vital signs reviewed and stable Respiratory status: spontaneous breathing, nonlabored ventilation and respiratory function stable Cardiovascular status: blood pressure returned to baseline and stable Postop Assessment: no headache, no backache, spinal receding and patient able to bend at knees Anesthetic complications: no    Last Vitals:  Vitals:   01/09/20 1500 01/09/20 1515  BP: 92/66 115/87  Pulse: 64 (!) 57  Resp: 11 12  Temp: 36.4 C   SpO2: 99% 99%    Last Pain:  Vitals:   01/09/20 1515  TempSrc:   PainSc: 0-No pain   Pain Goal:    LLE Motor Response: Purposeful movement (01/09/20 1515) LLE Sensation: Numbness, Tingling (01/09/20 1515) RLE Motor Response: Purposeful movement (01/09/20 1515) RLE Sensation: Numbness, Tingling (01/09/20 1515)     Epidural/Spinal Function Cutaneous sensation: Tingles (01/09/20 1500), Patient able to flex knees: No (01/09/20 1500), Patient able to lift hips off bed: No (01/09/20 1500), Back pain beyond tenderness at insertion site: No (01/09/20 1500), Progressively worsening motor and/or sensory loss: No (01/09/20 1500), Bowel and/or bladder incontinence post epidural: No (01/09/20 1500)  Lannie Fields

## 2020-01-09 NOTE — Lactation Note (Signed)
This note was copied from a baby's chart. Lactation Consultation Note  Patient Name: Jasmine Pierce [id number] Date: 01/09/2020   [facility] student entered the room of 3.5 hour old twin males. MOB and FOB present. Baby A located in bassinet.   Education provided included feeding cues, pumping after feeding, pump cleaning and storage, engorgement prevention, cluster feeding. Education considering supplementation provided in accordance with the LPTI sheet  MOB requested referral for pump and affirmed that she participates in the [facility] program at this time.   Feeding Plan  1. Observe feeding cues. Feed on demand, 8-12x per day 2. Pump every 3 hours 3. Offer breast first, then EBM via slow flow nipple.  Parents report all questions answered at this time and know to call for Lactation, as needed.   Maternal Data Formula Feeding for Exclusion: No Has patient been taught Hand Expression?: Yes Does the patient have breastfeeding experience prior to this delivery?: No  Feeding Feeding Type: Breast Fed  LATCH Score                   Interventions Interventions: DEBP;Coconut oil  Lactation Tools Discussed/Used Tools: Coconut oil;[facility] Program: Yes Initiated by:: [facility] Student Carlena Hurl) Date initiated:: 01/09/20   Consult Status      Carlena Hurl 01/09/2020, 7:20 PM

## 2020-01-09 NOTE — Anesthesia Procedure Notes (Signed)
Spinal  Patient location during procedure: OR Start time: 01/09/2020 1:30 PM End time: 01/09/2020 1:35 PM Staffing Performed: other anesthesia staff  Anesthesiologist: Lannie Fields, DO Resident/CRNA: Renford Dills, CRNA Preanesthetic Checklist Completed: patient identified, IV checked, site marked, risks and benefits discussed, surgical consent, monitors and equipment checked, pre-op evaluation and timeout performed Spinal Block Patient position: sitting Prep: ChloraPrep Patient monitoring: heart rate, continuous pulse ox and blood pressure Approach: midline Location: L3-4 Injection technique: single-shot Needle Needle type: Pencan  Needle gauge: 24 G Needle length: 10 cm Needle insertion depth: 7 cm Assessment Sensory level: T10 Additional Notes Performed by Heide Scales, SRNA

## 2020-01-09 NOTE — Op Note (Signed)
Preoperative diagnosis:  1.  Intrauterine pregnancy at [redacted]w[redacted]d  weeks gestation                                         2.  DiDi twins                                         3.  Twin A Breech                                         4.  Fetal Growth restriction Twin B                                         5.  Elevated Doppler ratios in both twins   Postoperative diagnosis:  Same as above  Procedure:  Primary cesarean section  Surgeon:  Lazaro Arms MD  Assistant:  Karyl Kinnier, MD  Anesthesia: Spinal  Findings:  .    Over a low transverse incision was delivered a viable female with Apgars of 9 and 9 weighing pending lbs.  Oz. Breech   1352 Twin B vertex Apgars 9/9 weight pending 1354 Uterus, tubes and ovaries were all normal.  There were no other significant findings  Description of operation:  Patient was taken to the operating room and placed in the sitting position where she underwent a spinal anesthetic. She was then placed in the supine position with tilt to the left side. When adequate anesthetic level was obtained she was prepped and draped in usual sterile fashion and a Foley catheter was placed. A Pfannenstiel skin incision was made and carried down sharply to the rectus fascia which was scored in the midline extended laterally. The fascia was taken off the muscles both superiorly and without difficulty. The muscles were divided.  The peritoneal cavity was entered.  Bladder blade was placed, no bladder flap was created.  A low transverse hysterotomy incision was made and delivered a viable female  infant at 13563 with Apgars of 9 and 9 weighing pending lbs  oz. Breech Twin B female 59 Apgars 9/9 Cord pH was obtained and was pending for both twins. The uterus was exteriorized. It was closed in 2 layers, the first being a running interlocking layer and the second being an imbricating layer using 0 monocryl on a CTX needle. There was good resulting hemostasis. The uterus tubes and ovaries were  all normal. Peritoneal cavity was irrigated vigorously. The muscles and peritoneum were reapproximated loosely. The fascia was closed using 0 Vicryl in running fashion. Subcutaneous tissue was made hemostatic and irrigated. The skin was closed using 4-0 Vicryl on a Keith needle in a subcuticular fashion.  Dermabond was placed for additional wound integrity and to serve as a barrier. Blood loss for the procedure was 173 cc. The patient received a gram of Ancef prophylactically. The patient was taken to the recovery room in good stable condition with all counts being correct x3.  EBL 173 cc  Lazaro Arms 01/09/2020 2:24 PM

## 2020-01-09 NOTE — Transfer of Care (Signed)
Immediate Anesthesia Transfer of Care Note  Patient: Jasmine Pierce  Procedure(s) Performed: CESAREAN SECTION MULTI-GESTATIONAL (N/A )  Patient Location: PACU  Anesthesia Type:Spinal  Level of Consciousness: awake  Airway & Oxygen Therapy: Patient Spontanous Breathing  Post-op Assessment: Report given to RN and Post -op Vital signs reviewed and stable  Post vital signs: Reviewed and stable  Last Vitals:  Vitals Value Taken Time  BP    Temp    Pulse    Resp    SpO2      Last Pain:  Vitals:   01/09/20 1246  TempSrc: Oral         Complications: No apparent anesthesia complications

## 2020-01-09 NOTE — Lactation Note (Signed)
This note was copied from a baby's chart. Lactation Consultation Note  Patient Name: Jasmine Pierce EAVWU'J Date: 01/09/2020   [facility] student entered room and MOB and FOB were present. Baby B was doing Skin to Skin with MOB when [facility] student entered the room.   [facility] student assisted with a latch in cradle hold on the left breast. Audible swallows noted. Encouraged MOB to let baby nurse until they stopped themselves. Feeding ended when baby fell asleep. Baby placed skin to skin and placed back in bassinet after feeding.   [facility] student assisted with pump set up, instructions, cleaning, and storage. 30cc EBM. MOB reported no breast pain while pumping, affirmed cramping "down there". 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 Assured MOB that it is okay to give this milk.   MOB affirmed that she participates in the [facility] program in [facility], that she doesn't have a pump at home. WIC referrals will be faxed to [facility].   Education provided regarding feeding cues, cluster feeding, importance of a wide open gape, normal newborn behavior, education in line with the LPTI sheet. Breastfeeding brochure reviewed.   Feeding Plan  1. Observe feeding cues, feed on demand STS, 8-12x/day 2. Pump q3 hours (with coconut oil prior to pumping) 3. Offer breast first, then offer EBM, according to LPTI policy.  Parents report all questions answered at this time and knows to call for lactation, PRN.   Maternal Data Formula Feeding for Exclusion: No Has patient been taught Hand Expression?: Yes Does the patient have breastfeeding experience prior to this delivery?: No  Feeding Feeding Type: Breast Fed   Interventions Interventions: Breast feeding basics reviewed;Assisted with latch;Skin to skin  Lactation Tools Discussed/Used WIC Program: Yes Pump Review: Setup, frequency, and cleaning;Milk Storage Initiated by:: [facility] Student Carlena Hurl) Date  initiated:: 01/09/20   Consult Status      Carlena Hurl 01/09/2020, 7:37 PM

## 2020-01-10 LAB — CBC
HCT: 35 % — ABNORMAL LOW (ref 36.0–46.0)
Hemoglobin: 11.7 g/dL — ABNORMAL LOW (ref 12.0–15.0)
MCH: 29.5 pg (ref 26.0–34.0)
MCHC: 33.4 g/dL (ref 30.0–36.0)
MCV: 88.4 fL (ref 80.0–100.0)
Platelets: 213 10*3/uL (ref 150–400)
RBC: 3.96 MIL/uL (ref 3.87–5.11)
RDW: 13.1 % (ref 11.5–15.5)
WBC: 14.5 10*3/uL — ABNORMAL HIGH (ref 4.0–10.5)
nRBC: 0 % (ref 0.0–0.2)

## 2020-01-10 NOTE — Progress Notes (Signed)
Subjective: Postpartum Day 1: Cesarean Delivery Patient reports nausea, incisional pain and tolerating PO.  Foley being taken out this morning  Objective: Vital signs in last 24 hours: Temp:  [97.5 F (36.4 C)-98.4 F (36.9 C)] 97.6 F (36.4 C) (04/01 0132) Pulse Rate:  [54-79] 54 (04/01 0132) Resp:  [11-20] 20 (04/01 0132) BP: (92-124)/(62-87) 107/72 (04/01 0132) SpO2:  [98 %-100 %] 98 % (04/01 0132)  Physical Exam:  General: alert, cooperative and no distress Lochia: appropriate Uterine Fundus: firm Incision: healing well, no significant drainage DVT Evaluation: No evidence of DVT seen on physical exam.  Recent Labs    01/07/20 0830 01/10/20 0436  HGB 13.3 11.7*  HCT 39.0 35.0*    Assessment/Plan: Status post Cesarean section. Doing well postoperatively.  Continue current care.  Wynelle Bourgeois 01/10/2020, 7:27 AM

## 2020-01-11 LAB — SURGICAL PATHOLOGY

## 2020-01-11 MED ORDER — MEDROXYPROGESTERONE ACETATE 150 MG/ML IM SUSP
150.0000 mg | Freq: Once | INTRAMUSCULAR | Status: AC
  Administered 2020-01-12: 150 mg via INTRAMUSCULAR
  Filled 2020-01-11: qty 1

## 2020-01-11 MED ORDER — MEDROXYPROGESTERONE ACETATE 150 MG/ML IM SUSP
150.0000 mg | Freq: Once | INTRAMUSCULAR | Status: DC
  Filled 2020-01-11: qty 1

## 2020-01-11 MED ORDER — OXYCODONE HCL 5 MG PO TABS: 5.0000 mg | ORAL_TABLET | ORAL | 0 refills | Status: DC | PRN

## 2020-01-11 MED ORDER — IBUPROFEN 800 MG PO TABS: 800.0000 mg | ORAL_TABLET | Freq: Four times a day (QID) | ORAL | 0 refills | Status: DC

## 2020-01-11 NOTE — Discharge Summary (Addendum)
Postpartum Discharge Summary     Patient Name: Jasmine Pierce DOB: 09-02-2001 MRN: [id number]  Date of admission: 01/09/2020 Delivering Provider:    Caryl Ada Sahvanna [[id number]]  Donnel Saxon, BoyB Arminta [[id number]]  Tania Ade H    Date of discharge: 01/12/2020  Admitting diagnosis: S/P cesarean section [Z98.891] Intrauterine pregnancy: [redacted]w[redacted]d    Secondary diagnosis:  Active Problems:   S/P cesarean section  Additional problems: didi twin pregnancy, FGR of both, malpresentation     Discharge diagnosis: Preterm Pregnancy Delivered                                                                                                Post partum procedures:none  Augmentation: none  Complications: None  Hospital course:  Sceduled C/S   19y.o. yo G2P0112 at 398w5das admitted to the hospital 01/09/2020 for scheduled cesarean section with the following indication:Malpresentation and Multifetal Gestation.  Membrane Rupture Time/Date:    IrCaryl Adamira [[id number]]1:51 PM    IrMathis Dadmira [[id number]]1:51 PM  ,   IrCaryl Adamira [[id number]]01/09/2020    IrMathis Dadmira [[id number]]01/09/2020    Patient delivered a Viable infant.   IrCaryl Adamira [[id number]]01/09/2020    IrBeebeBoSanta Claus0[[id number]]01/09/2020   Details of operation can be found in separate operative note.  Pateint had an uncomplicated postpartum course.  She is ambulating, tolerating a regular diet, passing flatus, and urinating well. Patient is discharged home in stable condition on  01/12/20        Delivery time:    IrCaryl Adamira [[id number]]1:53 PM    IrMathis Dadmira [[id number]]1:54 PM    Magnesium Sulfate received: No BMZ received: No Rhophylac:N/A MMR:N/A Transfusion:No  Physical exam  Vitals:   01/11/20 0600 01/11/20 1700 01/11/20 1949 01/12/20 0535  BP: 111/67 111/76 110/72 98/63  Pulse: 81 84 74 (!) 55  Resp:  '18  18 18  '$ Temp: 98.4 F (36.9 C) 98.1 F (36.7 C) 98 F (36.7 C) 97.8 F (36.6 C)  TempSrc: Oral Oral Oral Oral  SpO2: 100% 100%     General: alert, cooperative and no distress Lochia: appropriate Uterine Fundus: firm Incision: Healing well with no significant drainage, No significant erythema, Dressing is clean, dry, and intact DVT Evaluation: No evidence of DVT seen on physical exam. Negative Homan's sign. No cords or calf tenderness. No significant calf/ankle edema. Labs: Lab Results  Component Value Date   WBC 14.5 (H) 01/10/2020   HGB 11.7 (L) 01/10/2020   HCT 35.0 (L) 01/10/2020   MCV 88.4 01/10/2020   PLT 213 01/10/2020   CMP Latest Ref Rng & Units 12/24/2019  Glucose 65 - 99 mg/dL 73  BUN 6 - 20 mg/dL 11  Creatinine 0.57 - 1.00 mg/dL 0.65  Sodium 134 - 144 mmol/L 136  Potassium 3.5 - 5.2 mmol/L 4.3  Chloride 96 - 106 mmol/L 102  CO2 20 - 29 mmol/L 20  Calcium 8.7 - 10.2 mg/dL 9.0  Total Protein 6.0 - 8.5 g/dL 6.3  Total Bilirubin 0.0 - 1.2 mg/dL 0.2  Alkaline Phos 39 - 117 IU/L 132(H)  AST 0 - 40 IU/L 27  ALT 0 - 32 IU/L 14   Edinburgh Score: Edinburgh Postnatal Depression Scale Screening Tool 01/10/2020  I have been able to laugh and see the funny side of things. 0  I have looked forward with enjoyment to things. 0  I have blamed myself unnecessarily when things went wrong. 0  I have been anxious or worried for no good reason. 0  I have felt scared or panicky for no good reason. 0  Things have been getting on top of me. 0  I have been so unhappy that I have had difficulty sleeping. 0  I have felt sad or miserable. 0  I have been so unhappy that I have been crying. 0  The thought of harming myself has occurred to me. 0  Edinburgh Postnatal Depression Scale Total 0    Discharge instruction: per After Visit Summary and "Baby and Me Booklet".  After visit meds:  Allergies as of 01/12/2020   No Known Allergies     Medication List    STOP taking these  medications   aspirin EC 81 MG tablet   folic acid 481 MCG tablet Commonly known as: FOLVITE   predniSONE 20 MG tablet Commonly known as: DELTASONE     TAKE these medications   Blood Pressure Monitor Misc For regular home bp monitoring during pregnancy   hydrocortisone 2.5 % rectal cream Commonly known as: Anusol-HC Place 1 application rectally 2 (two) times daily.   ibuprofen 800 MG tablet Commonly known as: ADVIL Take 1 tablet (800 mg total) by mouth every 6 (six) hours.   loratadine 10 MG tablet Commonly known as: Claritin Take 1 tablet (10 mg total) by mouth daily.   oxyCODONE 5 MG immediate release tablet Commonly known as: Oxy IR/ROXICODONE Take 1 tablet (5 mg total) by mouth every 4 (four) hours as needed for moderate pain.   pantoprazole 20 MG tablet Commonly known as: PROTONIX Take 1 tablet (20 mg total) by mouth daily.   prenatal vitamin w/FE, FA 27-1 MG Tabs tablet Take 1 tablet by mouth daily at 12 noon.       Diet: routine diet  Activity: Advance as tolerated. Pelvic rest for 6 weeks.   Outpatient follow up:2 weeks Follow up Appt: Future Appointments  Date Time Provider [place]  01/22/2020 11:30 AM Florian Buff, MD [facility]   Follow up Visit: Follow-up Information    [facility] Follow up.   Why: As scheduled for postpartum visit Contact information: [address] [place] [address] [phone]          Please schedule this patient for Postpartum visit in: 6 weeks with the following provider: Any provider In-Person For C/S patients schedule nurse incision check in weeks 2 weeks: yes Low risk pregnancy complicated by: none Delivery mode:  CS Anticipated Birth Control:  PP Depo given PP Procedures needed: Incision check  Schedule Integrated BH visit: no   Newborn Data:   Jandy, Brackens [[id number]]  Live born female  Birth Weight: 5 lb 9.8 oz (2545 g) APGAR: 74, 9  Newborn  Delivery   Birth date/time: 01/09/2020 13:53:00 Delivery type: C-Section, Low Transverse Trial of labor: No C-section categorization: Primary       Mathis Dad Anastasia [[id number]]  Live born female  Birth Weight: 4 lb  11.3 oz (2135 g) APGAR: 9, 9  Newborn Delivery   Birth date/time: 01/09/2020 13:54:00 Delivery type: C-Section, Low Transverse Trial of labor: No C-section categorization: Primary     Baby Feeding: Breast Disposition:home with mother  01/12/2020 Fatima Blank, CNM

## 2020-01-11 NOTE — Lactation Note (Addendum)
This note was copied from a baby's chart. Lactation Consultation Note Baby's 38 hrs old. Mom sleeping. FOB awake. Noted Gerber formula on counter and being charted. Took Similac 22 cal. And a few purple nipples to room. Mentioned to FOB babies need higher calorie formula. Encouraged to use Similac instead of Gerber. Asked FOB to get mom to call for next feeding for LC to assist in latching. He stated OK. Asked FOB if mom was pumping, he stated some.  Patient Name: Jasmine Pierce [id number]'[id number] Date: 01/11/2020     Maternal Data    Feeding    LATCH Score                   Interventions    Lactation Tools Discussed/Used     Consult Status      Charyl Dancer 01/11/2020, 4:50 AM

## 2020-01-11 NOTE — Lactation Note (Addendum)
This note was copied from a baby's chart. Lactation Consultation Note  Patient Name: Jasmine Pierce JSEGB'T Date: 01/11/2020   Twins CGA 37 weeks.  Twin A recently breastfeed for 25 min. They are being supplement with mother's milk when available and the difference w/ formula. Mother has been pumping when able.  She tried to call [facility] but no answer due to holiday. Offered mother [facility] loaner and mother states she did not want to come back to [place] to return pump.  Encouraged her to think about it since she will need the pump once she gets home. Faxed WIC referral to [facility]. Reviewed feeding. Plan: 1. Offer breast when twins cue that he/she is hungry, or awaken baby for feeding at 3 hrs. 2.  Breast feed baby, asking for help prn.  Limit to 15-25 mins so not to overtire baby. 3. If baby does not latch after 10 min of attempt - give supplemental breastmilk/formula.  Slow flow nipple bottle is an option.  4.  Pump both breasts 15-20 minutes on initiation setting, adding breast massage and hand expression to collect as much colostrum as possible to feed baby. 5.  Feed baby EBM+/formula after breastfeeding per LPTI volume guidelines increasing per day of life and as baby desires.  Current goal 20-20 ml.       Maternal Data    Feeding Feeding Type: Breast Fed  LATCH Score Latch: Repeated attempts needed to sustain latch, nipple held in mouth throughout feeding, stimulation needed to elicit sucking reflex.  Audible Swallowing: A few with stimulation  Type of Nipple: Everted at rest and after stimulation  Comfort (Breast/Nipple): Soft / non-tender  Hold (Positioning): Assistance needed to correctly position infant at breast and maintain latch.  LATCH Score: 7  Interventions    Lactation Tools Discussed/Used     Consult Status      Dahlia Byes [facility] 01/11/2020, 2:07 PM

## 2020-01-12 NOTE — Plan of Care (Signed)
  Problem: Education: Goal: Knowledge of condition will improve Outcome: Completed/Met   Problem: Activity: Goal: Will verbalize the importance of balancing activity with adequate rest periods Outcome: Completed/Met Goal: Ability to tolerate increased activity will improve Outcome: Completed/Met   Problem: Skin Integrity: Goal: Demonstration of wound healing without infection will improve Outcome: Completed/Met   Problem: Education: Goal: Knowledge of General Education information will improve Description: Including pain rating scale, medication(s)/side effects and non-pharmacologic comfort measures Outcome: Completed/Met   Problem: Health Behavior/Discharge Planning: Goal: Ability to manage health-related needs will improve Outcome: Completed/Met   Problem: Clinical Measurements: Goal: Ability to maintain clinical measurements within normal limits will improve Outcome: Completed/Met Goal: Will remain free from infection Outcome: Completed/Met Goal: Diagnostic test results will improve Outcome: Completed/Met   Problem: Activity: Goal: Risk for activity intolerance will decrease Outcome: Completed/Met   Problem: Elimination: Goal: Will not experience complications related to bowel motility Outcome: Completed/Met Goal: Will not experience complications related to urinary retention Outcome: Completed/Met   Problem: Pain Managment: Goal: General experience of comfort will improve Outcome: Completed/Met   Problem: Safety: Goal: Ability to remain free from injury will improve Outcome: Completed/Met   Problem: Skin Integrity: Goal: Risk for impaired skin integrity will decrease Outcome: Completed/Met

## 2020-01-12 NOTE — Lactation Note (Signed)
Lactation Consultation Note  Patient Name: Jasmine Pierce LXBWI'O Date: 01/12/2020  Maternal Data    Feeding    LATCH Score                   Interventions    Lactation Tools Discussed/Used     Consult Status Consult Status: Complete    Sherryn Pollino R Wayne Wicklund 01/12/2020, 10:58 AM

## 2020-01-12 NOTE — Lactation Note (Signed)
This note was copied from a baby's chart. Lactation Consultation Note  Patient Name: Jasmine Pierce [id number]'I Date: 01/12/2020 Reason for consult: Follow-up assessment   P2 mother whose infant twin boys are now 18 hours old.  These are LPTI botn at 36+5 weeks with a CGA of 37+1 weeks.  The babies weigh < 6 lbs.  Mother's feeding preference is breast/bottle.  Both babies were swaddled and asleep when I arrived.  Reviewed mother's feeding plan to date and established a feeding plan for after discharge.  Discussed the LPTI volume supplementation guidelines and encouraged mother to increase supplementation amounts to 30 mls or more after 1500 today.  She has, at times, been feeding the babies every 1 hour.  Encouraged feeding higher volumes so she would not be feeding every hour.  Discussed feeding cues, how to conserve the babies energy levels and how to keep them actively engaged during feedings.  Mother has not attempted to latch to the breast very much so far.  She stated her ultimate goal is to breast feed only.  Reviewed putting babies to the breast first, but not spending more than 5-10 minutes attempting to latch.  If not interested in latching mother will supplement with her EBM prior to using any formula.  She will continue to post pump every three hours for 15 minutes.  Mother has already established a good milk supply in her refrigerator.  Praised her efforts.  Discussed how to make her feeding plan more effective so she will be allowed some "down time" to rest between feedings/pumpings.  Mother verbalized understanding.  Mother does not have a DEBP for home use.  The [facility] office sent a message and she tried to return a phone call on Friday.  However, due to the Good Friday holiday there was no answer.  Originally, mother was not interested in obtaining a loaner pump because she did not wish to drive from [place] to [place] to return it.   I explained the importance of being able to  stimulate and provide EBM for her twins.  Without a DEBP I informed her that she will not be able to adequately stimulate her breasts for the next two days to be certain of a continued good milk supply.  Mother since has decided to obtain a [facility] loaner pump.  It is important to note that in mother's chart there was a note about returning for a SL return visit in one week.  Mother did not know anything about this visit.  I provided the name of the consultant and the phone number so mother can call Monday morning to schedule this visit.  With parents needing to return to [facility] for this visit I suggested they return the DEBP that same day.  Mother much more willing to do this now that she will be here in a week.  Informed her that she has up to 12 days to return the loaner pump.  Paper work filled out and placed in folder in cabinets.  Pump given and mother reminded to take all tubing with her.  Manual pump provided with instructions for use.  #24 flange size is appropriate at this time.  Encouraged lots of hand expression before/after pumping.  Mother will call me for any further questions/concerns.  Rn updated.              Consult Status Consult Status: Complete Date: 01/12/20 Follow-up type: Call as needed    Jasmine Pierce 01/12/2020, 10:59 AM

## 2020-01-13 ENCOUNTER — Ambulatory Visit: Payer: Self-pay

## 2020-01-13 NOTE — Lactation Note (Signed)
This note was copied from a baby's chart. Lactation Consultation Note  Patient Name: Jasmine Pierce VHQIO'N Date: 01/13/2020 Reason for consult: Follow-up assessment;Multiple gestation;Late-preterm 34-36.6wks;Infant < 6lbs Babies are 55 hours old and both at a 4% weight loss.  Mom has recently been giving formula and states she has not pumped since yesterday.  Breasts are full but not engorged.  Stressed importance of pumping every 3 hours to maintain a good milk supply.  Mom will call when she is ready for a [facility] loaner pump.  Maternal Data    Feeding Feeding Type: Bottle Fed - Breast Milk  LATCH Score                   Interventions    Lactation Tools Discussed/Used     Consult Status Consult Status: Complete Follow-up type: Call as needed    Huston Foley 01/13/2020, 9:40 AM

## 2020-01-22 ENCOUNTER — Encounter: Payer: Self-pay | Admitting: Obstetrics & Gynecology

## 2020-01-22 ENCOUNTER — Ambulatory Visit (INDEPENDENT_AMBULATORY_CARE_PROVIDER_SITE_OTHER): Payer: Medicaid Other | Admitting: Obstetrics & Gynecology

## 2020-01-22 ENCOUNTER — Other Ambulatory Visit: Payer: Self-pay

## 2020-01-22 VITALS — BP 104/73 | HR 77 | Ht 63.0 in | Wt 124.0 lb

## 2020-01-22 DIAGNOSIS — Z98891 History of uterine scar from previous surgery: Secondary | ICD-10-CM

## 2020-01-22 MED ORDER — OXYCODONE-ACETAMINOPHEN 5-325 MG PO TABS: 1.0000 | ORAL_TABLET | ORAL | 0 refills | Status: DC | PRN

## 2020-01-22 NOTE — Progress Notes (Signed)
  HPI: Patient returns for routine postoperative follow-up having undergone primary c section for twins on 01/09/20.  The patient's immediate postoperative recovery has been unremarkable. Since hospital discharge the patient reports no problems.   Current Outpatient Medications: .  ibuprofen (ADVIL) 800 MG tablet, Take 1 tablet (800 mg total) by mouth every 6 (six) hours., Disp: 30 tablet, Rfl: 0 .  Blood Pressure Monitor MISC, For regular home bp monitoring during pregnancy (Patient not taking: Reported on 01/22/2020), Disp: 1 each, Rfl: 0 .  hydrocortisone (ANUSOL-HC) 2.5 % rectal cream, Place 1 application rectally 2 (two) times daily. (Patient not taking: Reported on 01/22/2020), Disp: 30 g, Rfl: 0 .  loratadine (CLARITIN) 10 MG tablet, Take 1 tablet (10 mg total) by mouth daily. (Patient not taking: Reported on 01/22/2020), Disp: 30 tablet, Rfl: 1 .  oxyCODONE (OXY IR/ROXICODONE) 5 MG immediate release tablet, Take 1 tablet (5 mg total) by mouth every 4 (four) hours as needed for moderate pain. (Patient not taking: Reported on 01/22/2020), Disp: 20 tablet, Rfl: 0 .  oxyCODONE-acetaminophen (PERCOCET) 5-325 MG tablet, Take 1-2 tablets by mouth every 4 (four) hours as needed for severe pain., Disp: 30 tablet, Rfl: 0 .  pantoprazole (PROTONIX) 20 MG tablet, Take 1 tablet (20 mg total) by mouth daily. (Patient not taking: Reported on 01/22/2020), Disp: 30 tablet, Rfl: 6 .  prenatal vitamin w/FE, FA (PRENATAL 1 + 1) 27-1 MG TABS tablet, Take 1 tablet by mouth daily at 12 noon. (Patient not taking: Reported on 01/22/2020), Disp: 30 tablet, Rfl: 12  No current facility-administered medications for this visit.    Blood pressure 104/73, pulse 77, height 5\' 3"  (1.6 m), weight 124 lb (56.2 kg), last menstrual period 04/27/2019, currently breastfeeding.  Physical Exam: incison clean dry intact Abdomen normal post op benign  Diagnostic Tests: none  Pathology:   Impression: S/p primary C section for  twins  Plan:   Follow up: 5  weeks  04/29/2019, MD

## 2020-02-01 ENCOUNTER — Inpatient Hospital Stay (HOSPITAL_COMMUNITY): Admit: 2020-02-01 | Payer: Self-pay

## 2020-02-26 ENCOUNTER — Ambulatory Visit (INDEPENDENT_AMBULATORY_CARE_PROVIDER_SITE_OTHER): Payer: Medicaid Other | Admitting: Women's Health

## 2020-02-26 ENCOUNTER — Encounter: Payer: Self-pay | Admitting: Women's Health

## 2020-02-26 ENCOUNTER — Other Ambulatory Visit: Payer: Self-pay

## 2020-02-26 DIAGNOSIS — Z98891 History of uterine scar from previous surgery: Secondary | ICD-10-CM

## 2020-02-26 NOTE — Progress Notes (Signed)
POSTPARTUM VISIT Patient name: Jasmine Pierce MRN [id number]  Date of birth: 04-Feb-2001 Chief Complaint:   Postpartum Care  History of Present Illness:   Jasmine Pierce is a 19 y.o. [phone] female being seen today for a postpartum visit. She is 7 weeks postpartum following a primary cesarean section, low transverse incision at 36.5 gestational weeks d/t di-di twins, twin A breech, twin B w/ FGR, both twins w/ elevated dopplers. Anesthesia: spinal.  I have fully reviewed the prenatal and intrapartum course. Pregnancy complicated by di-di twin gestation, twinB w/ FGR and both twins w/ elevated dopplers. Postpartum course has been uncomplicated. Bleeding thin lochia. Bowel function is normal. Bladder function is normal.  Patient is not sexually active. Last sexual activity: prior to birth of baby.    No LMP recorded. Patient has had an injection.  Baby's course has been uncomplicated. Baby is feeding by bottle    Edinburgh Postpartum Depression Screening:  Edinburgh Postnatal Depression Scale - 02/26/20 1123      Edinburgh Postnatal Depression Scale:  In the Past 7 Days   I have been able to laugh and see the funny side of things.  1    I have looked forward with enjoyment to things.  0    I have blamed myself unnecessarily when things went wrong.  0    I have been anxious or worried for no good reason.  0    I have felt scared or panicky for no good reason.  0    Things have been getting on top of me.  1    I have been so unhappy that I have had difficulty sleeping.  0    I have felt sad or miserable.  0    I have been so unhappy that I have been crying.  1    The thought of harming myself has occurred to me.  0    Edinburgh Postnatal Depression Scale Total  3      Review of Systems:   Pertinent items are noted in HPI Denies Abnormal vaginal discharge w/ itching/odor/irritation, headaches, visual changes, shortness of breath, chest pain, abdominal pain, severe nausea/vomiting, or  problems with urination or bowel movements. Pertinent History Reviewed:  Reviewed past medical,surgical, obstetrical and family history.  Reviewed problem list, medications and allergies. OB History  Gravida Para Term Preterm AB Living  2 1 0 '1 1 2  '$ SAB TAB Ectopic Multiple Live Births  1 0 0 1 2    # Outcome Date GA Lbr Len/2nd Weight Sex Delivery Anes PTL Lv  2A Preterm 01/09/20 [redacted]w[redacted]d 5 lb 9.8 oz (2.545 kg) M CS-LTranv Spinal  LIV  2B Preterm 01/09/20 [id number] lb 11.3 oz (2.135 kg) M CS-LTranv Spinal  LIV  1 SAB            Physical Assessment:   Vitals:   02/26/20 1118  BP: 109/82  Weight: 122 lb (55.3 kg)  Height: '5\' 3"'$  (1.6 m)  Body mass index is 21.61 kg/m.       Physical Examination:   General appearance: alert, well appearing, and in no distress  Mental status: alert, oriented to person, place, and time  Skin: warm & dry   Cardiovascular: normal heart rate noted   Respiratory: normal respiratory effort, no distress   Breasts: deferred, no complaints   Abdomen: soft, non-tender, c/s incision well-healed  Pelvic: examination not indicated  Rectal: not examined   Extremities: no edema  No results found for this or any previous visit (from the past 24 hour(s)).  Assessment & Plan:  1) Postpartum exam 2) 7 wks s/p PLTCS @ 36.5wks for di-di twins w/ twin A breech, twin B FGR, both w/ elevated dopplers 3) Bottlefeeding 4) Depression screening  Essential components of care per ACOG recommendations:  1.  Mood and well being:  . Patient with negative depression screening today.  . Pre-existing mental health disorders? No  . Patient does not use tobacco. If using tobacco we discussed reduction/cessation and risk of relapse . Substance use disorder? No    2. Infant care and feeding:  . Patient currently breastfeeding? No  . Childcare strategy if returning to work/school-her friend and FOB . Infant has a pediatrician/family doctor? Yes  . Recommended that all  caregivers be immunized for flu, pertussis and other preventable communicable diseases . Pt does have material needs met such as stable housing, utilities, food and diapers. If not, referred to local resources for help obtaining these.  3. Sexuality, contraception and birth spacing . Provided guidance regarding sexuality, management of dyspareunia, and resumption of intercourse . Patient does want a pregnancy in the future.  Desired family size is 3 children.  . Discussed avoiding interpregnancy interval <48mhs and recommended birth spacing of 18 months . Reviewed forms of contraception. Patient desires Depo-Provera injections-got 1st dose in hospital, scheduled for next dose 6/21.    4. Sleep and fatigue . Discussed coping options for fatigue and sleep disruption . Encouraged family/partner/community support of 4 hrs of uninterrupted sleep to help with mood and fatigue  5. Physical recovery  . Pt did  have a cesarean section. If yes, assessed incisional pain and providing guidance on normal vs prolonged recovery . Patient had a n/a laceration  . Patient has urinary incontinence? No, fecal incontinence? No  . Patient is safe to resume physical activity. Discussed attainment of healthy weight.  6.  Chronic disease management . Discussed pregnancy complications if any, and their implications for future childbearing and long-term maternal health. . Review recommendations for prevention of recurrent pregnancy complications, such as 17 hydroxyprogesterone caproate to reduce risk for recurrent PTB not applicable, or aspirin to reduce risk of preeclampsia not applicable. . Pt had GDM: No.  . Reviewed medications and non-pregnant dosing including consideration of whether pt is breastfeeding using a reliable resource such as LactMed: not applicable . Referred for f/u w/ PCP or subspecialist providers as indicated: not applicable  7. Health maintenance . Last pap smear <21yo, turns 19yo 6/3 and  results were n/a . Mammogram at 89yoor earlier if indicated  Meds: No orders of the defined types were placed in this encounter.   Follow-up: Return for Pap & physical on 6/21 w/ depo shot please.   No orders of the defined types were placed in this encounter.   [facility]5/18/2021 11:47 AM

## 2020-03-31 ENCOUNTER — Encounter: Payer: Self-pay | Admitting: Women's Health

## 2020-03-31 ENCOUNTER — Other Ambulatory Visit (HOSPITAL_COMMUNITY)
Admission: RE | Admit: 2020-03-31 | Discharge: 2020-03-31 | Disposition: A | Discharge status: Home / Self Care | Payer: Medicaid Other | Source: Ambulatory Visit | Attending: Obstetrics and Gynecology | Admitting: Obstetrics and Gynecology

## 2020-03-31 ENCOUNTER — Ambulatory Visit (INDEPENDENT_AMBULATORY_CARE_PROVIDER_SITE_OTHER): Payer: Medicaid Other | Admitting: Women's Health

## 2020-03-31 ENCOUNTER — Other Ambulatory Visit: Payer: Self-pay | Admitting: Women's Health

## 2020-03-31 ENCOUNTER — Ambulatory Visit: Payer: Medicaid Other

## 2020-03-31 VITALS — BP 114/75 | HR 68 | Ht 63.0 in | Wt 124.0 lb

## 2020-03-31 DIAGNOSIS — Z01419 Encounter for gynecological examination (general) (routine) without abnormal findings: Secondary | ICD-10-CM | POA: Insufficient documentation

## 2020-03-31 DIAGNOSIS — Z3042 Encounter for surveillance of injectable contraceptive: Secondary | ICD-10-CM | POA: Diagnosis not present

## 2020-03-31 DIAGNOSIS — M792 Neuralgia and neuritis, unspecified: Secondary | ICD-10-CM

## 2020-03-31 MED ORDER — MEDROXYPROGESTERONE ACETATE 150 MG/ML IM SUSP: 150.0000 mg | INTRAMUSCULAR | 3 refills | Status: DC

## 2020-03-31 MED ORDER — MEDROXYPROGESTERONE ACETATE 150 MG/ML IM SUSP
150.0000 mg | Freq: Once | INTRAMUSCULAR | Status: AC
  Administered 2020-03-31: 150 mg via INTRAMUSCULAR

## 2020-03-31 NOTE — Progress Notes (Addendum)
WELL-WOMAN EXAMINATION Patient name: Jasmine Pierce MRN [id number]  Date of birth: 2001-09-08 Chief Complaint:   Gynecologic Exam  History of Present Illness:   Jasmine Pierce is a 19 y.o. [phone] African American female being seen today for a routine well-woman exam.  Current complaints: mid back pain radiating into leg and will almost fall. Constant soreness above c/s incision w/ random sharp shooting pains since c/s 3/31. Takes apap/ibuprofen but doesn't help. Normal bm's 1-2x/day, no UTI s/s.   Depression screen [facility] 2/9 03/31/2020 07/27/2019 05/03/2018  Decreased Interest 0 1 0  Down, Depressed, Hopeless 1 0 0  PHQ - 2 Score 1 1 0  Altered sleeping 0 3 -  Tired, decreased energy 1 1 -  Change in appetite 1 1 -  Feeling bad or failure about yourself  0 0 -  Trouble concentrating 0 0 -  Moving slowly or fidgety/restless 0 0 -  Suicidal thoughts 0 0 -  PHQ-9 Score 3 6 -  Difficult doing work/chores Somewhat difficult - -     PCP: none      does not desire labs No LMP recorded. Patient has had an injection. The current method of family planning is Depo-Provera injections.  Last pap never. Results were: N/A. H/O abnormal pap: no Last mammogram: never. Results were: N/A. Family h/o breast cancer: no Last colonoscopy: never. Results were: N/A. Family h/o colorectal cancer: no Review of Systems:   Pertinent items are noted in HPI Denies any headaches, blurred vision, fatigue, shortness of breath, chest pain, abdominal pain, abnormal vaginal discharge/itching/odor/irritation, problems with periods, bowel movements, urination, or intercourse unless otherwise stated above. Pertinent History Reviewed:  Reviewed past medical,surgical, social and family history.  Reviewed problem list, medications and allergies. Physical Assessment:   Vitals:   03/31/20 1029  BP: 114/75  Pulse: 68  Weight: 124 lb (56.2 kg)  Height: 5\' 3"  (1.6 m)  Body mass index is 21.97 kg/m.        Physical  Examination:   General appearance - well appearing, and in no distress  Mental status - alert, oriented to person, place, and time  Psych:  She has a normal mood and affect  Skin - warm and dry, normal color, no suspicious lesions noted  Chest - effort normal, all lung fields clear to auscultation bilaterally  Heart - normal rate and regular rhythm  Neck:  midline trachea, no thyromegaly or nodules  Breasts - breasts appear normal, no suspicious masses, no skin or nipple changes or  axillary nodes  Abdomen - soft, nondistended, no masses or organomegaly. +tenderness/guarding above c/s incision- incision itself normal/non-tender. Co-exam w/ LHE, feels it is ilio-hypogastric nerve, injected w/ 10cc marcaine w/ significant improvement of pain 10 mins later.   Pelvic - VULVA: normal appearing vulva with no masses, tenderness or lesions  VAGINA: normal appearing vagina with normal color and discharge, no lesions  CERVIX: normal appearing cervix without discharge or lesions, no CMT  Thin prep pap is done w/ HR HPV cotesting  UTERUS: uterus is felt to be normal size, shape, consistency and nontender   ADNEXA: No adnexal masses or tenderness noted.  Extremities:  No swelling or varicosities noted  Chaperone: Peggy Dones    No results found for this or any previous visit (from the past 24 hour(s)).  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 Despina Hidden, f/u 2wks w/ him  Labs/procedures today: pap, depo  Mammogram @21yo  or sooner if problems Colonoscopy @21yo  or sooner if problems  No orders of the defined types were placed in this encounter.   Meds:  Meds ordered this encounter  Medications  . medroxyPROGESTERone (DEPO-PROVERA) injection  150 mg    Follow-up: Return for 9/6-9/20 for depo; 2wk in office visit w/ Dr. only.  05-19-1995 CNM, [facility] 03/31/2020 11:44 AM

## 2020-03-31 NOTE — Patient Instructions (Signed)
Sciatica Rehab Ask your health care provider which exercises are safe for you. Do exercises exactly as told by your health care provider and adjust them as directed. It is normal to feel mild stretching, pulling, tightness, or discomfort as you do these exercises. Stop right away if you feel sudden pain or your pain gets worse. Do not begin these exercises until told by your health care provider. Stretching and range-of-motion exercises These exercises warm up your muscles and joints and improve the movement and flexibility of your hips and back. These exercises also help to relieve pain, numbness, and tingling. Sciatic nerve glide 1. Sit in a chair with your head facing down toward your chest. Place your hands behind your back. Let your shoulders slump forward. 2. Slowly straighten one of your legs while you tilt your head back as if you are looking toward the ceiling. Only straighten your leg as far as you can without making your symptoms worse. 3. Hold this position for __________ seconds. 4. Slowly return to the starting position. 5. Repeat with your other leg. Repeat __________ times. Complete this exercise __________ times a day. Knee to chest with hip adduction and internal rotation  1. Lie on your back on a firm surface with both legs straight. 2. Bend one of your knees and move it up toward your chest until you feel a gentle stretch in your lower back and buttock. Then, move your knee toward the shoulder that is on the opposite side from your leg. This is hip adduction and internal rotation. ? Hold your leg in this position by holding on to the front of your knee. 3. Hold this position for __________ seconds. 4. Slowly return to the starting position. 5. Repeat with your other leg. Repeat __________ times. Complete this exercise __________ times a day. Prone extension on elbows  1. Lie on your abdomen on a firm surface. A bed may be too soft for this exercise. 2. Prop yourself up on  your elbows. 3. Use your arms to help lift your chest up until you feel a gentle stretch in your abdomen and your lower back. ? This will place some of your body weight on your elbows. If this is uncomfortable, try stacking pillows under your chest. ? Your hips should stay down, against the surface that you are lying on. Keep your hip and back muscles relaxed. 4. Hold this position for __________ seconds. 5. Slowly relax your upper body and return to the starting position. Repeat __________ times. Complete this exercise __________ times a day. Strengthening exercises These exercises build strength and endurance in your back. Endurance is the ability to use your muscles for a long time, even after they get tired. Pelvic tilt This exercise strengthens the muscles that lie deep in the abdomen. 1. Lie on your back on a firm surface. Bend your knees and keep your feet flat on the floor. 2. Tense your abdominal muscles. Tip your pelvis up toward the ceiling and flatten your lower back into the floor. ? To help with this exercise, you may place a small towel under your lower back and try to push your back into the towel. 3. Hold this position for __________ seconds. 4. Let your muscles relax completely before you repeat this exercise. Repeat __________ times. Complete this exercise __________ times a day. Alternating arm and leg raises  1. Get on your hands and knees on a firm surface. If you are on a hard floor, you may want to use   padding, such as an exercise mat, to cushion your knees. 2. Line up your arms and legs. Your hands should be directly below your shoulders, and your knees should be directly below your hips. 3. Lift your left leg behind you. At the same time, raise your right arm and straighten it in front of you. ? Do not lift your leg higher than your hip. ? Do not lift your arm higher than your shoulder. ? Keep your abdominal and back muscles tight. ? Keep your hips facing the  ground. ? Do not arch your back. ? Keep your balance carefully, and do not hold your breath. 4. Hold this position for __________ seconds. 5. Slowly return to the starting position. 6. Repeat with your right leg and your left arm. Repeat __________ times. Complete this exercise __________ times a day. Posture and body mechanics Good posture and healthy body mechanics can help to relieve stress in your body's tissues and joints. Body mechanics refers to the movements and positions of your body while you do your daily activities. Posture is part of body mechanics. Good posture means:  Your spine is in its natural S-curve position (neutral).  Your shoulders are pulled back slightly.  Your head is not tipped forward. Follow these guidelines to improve your posture and body mechanics in your everyday activities. Standing   When standing, keep your spine neutral and your feet about hip width apart. Keep a slight bend in your knees. Your ears, shoulders, and hips should line up.  When you do a task in which you stand in one place for a long time, place one foot up on a stable object that is 2-4 inches (5-10 cm) high, such as a footstool. This helps keep your spine neutral. Sitting   When sitting, keep your spine neutral and keep your feet flat on the floor. Use a footrest, if necessary, and keep your thighs parallel to the floor. Avoid rounding your shoulders, and avoid tilting your head forward.  When working at a desk or a computer, keep your desk at a height where your hands are slightly lower than your elbows. Slide your chair under your desk so you are close enough to maintain good posture.  When working at a computer, place your monitor at a height where you are looking straight ahead and you do not have to tilt your head forward or downward to look at the screen. Resting  When lying down and resting, avoid positions that are most painful for you.  If you have pain with activities  such as sitting, bending, stooping, or squatting, lie in a position in which your body does not bend very much. For example, avoid curling up on your side with your arms and knees near your chest (fetal position).  If you have pain with activities such as standing for a long time or reaching with your arms, lie with your spine in a neutral position and bend your knees slightly. Try the following positions: ? Lying on your side with a pillow between your knees. ? Lying on your back with a pillow under your knees. Lifting   When lifting objects, keep your feet at least shoulder width apart and tighten your abdominal muscles.  Bend your knees and hips and keep your spine neutral. It is important to lift using the strength of your legs, not your back. Do not lock your knees straight out.  Always ask for help to lift heavy or awkward objects. This information is not   intended to replace advice given to you by your health care provider. Make sure you discuss any questions you have with your health care provider. Document Revised: 01/19/2019 Document Reviewed: 10/19/2018 Elsevier Patient Education  2020 [employer]  

## 2020-04-01 ENCOUNTER — Telehealth: Payer: Self-pay | Admitting: *Deleted

## 2020-04-01 ENCOUNTER — Encounter: Payer: Self-pay | Admitting: *Deleted

## 2020-04-01 LAB — CYTOLOGY - PAP: Diagnosis: NEGATIVE

## 2020-04-01 NOTE — Telephone Encounter (Signed)
Telephoned patient at home number and advised work note was approved. Patient can check my chart for note. Patient voiced understanding.

## 2020-04-15 ENCOUNTER — Other Ambulatory Visit: Payer: Self-pay

## 2020-04-15 ENCOUNTER — Ambulatory Visit (INDEPENDENT_AMBULATORY_CARE_PROVIDER_SITE_OTHER): Payer: Medicaid Other | Admitting: Obstetrics & Gynecology

## 2020-04-15 ENCOUNTER — Encounter: Payer: Self-pay | Admitting: Obstetrics & Gynecology

## 2020-04-15 VITALS — BP 105/71 | HR 74 | Ht 63.0 in | Wt 123.0 lb

## 2020-04-15 DIAGNOSIS — M792 Neuralgia and neuritis, unspecified: Secondary | ICD-10-CM

## 2020-04-15 NOTE — Progress Notes (Signed)
Trigger Point Injection   Pre-operative diagnosis: iliohypogastric neuralgia  Post-operative diagnosis: same  After risks and benefits were explained including bleeding, infection, worsening of the pain, damage to the area being injected, weakness, allergic reaction to medications, vascular injection, and nerve damage, signed consent was obtained.  All questions were answered.    The area of the trigger point was identified and the skin prepped three times with alcohol and the alcohol allowed to dry.  Next, a 25 gauge 0.5 inch needle was placed in the area of the trigger point.  Once reproduction of the pain was elicited and negative aspiration confirmed, the trigger point was injected and the needle removed.    The patient did tolerate the procedure well and there were not complications.    Medication used:  10 cc 0.5% Marcaine, plain    Trigger points injected: 1    Trigger point(s) location(s):  right   2nd injection cycle

## 2020-05-06 ENCOUNTER — Ambulatory Visit: Payer: Medicaid Other | Admitting: Obstetrics & Gynecology

## 2020-05-13 ENCOUNTER — Ambulatory Visit: Payer: Medicaid Other | Admitting: Obstetrics & Gynecology

## 2020-05-21 ENCOUNTER — Ambulatory Visit: Payer: Medicaid Other | Admitting: Family Medicine

## 2020-06-09 ENCOUNTER — Other Ambulatory Visit: Payer: Self-pay | Admitting: Women's Health

## 2020-06-09 MED ORDER — MEGESTROL ACETATE 40 MG PO TABS: ORAL_TABLET | ORAL | 1 refills | Status: DC

## 2020-06-23 ENCOUNTER — Encounter: Payer: Medicaid Other | Admitting: *Deleted

## 2020-06-23 ENCOUNTER — Other Ambulatory Visit: Payer: Self-pay

## 2020-06-23 ENCOUNTER — Ambulatory Visit: Payer: Medicaid Other | Admitting: *Deleted

## 2020-06-25 ENCOUNTER — Ambulatory Visit: Payer: Medicaid Other

## 2020-06-30 ENCOUNTER — Ambulatory Visit (INDEPENDENT_AMBULATORY_CARE_PROVIDER_SITE_OTHER): Payer: Medicaid Other | Admitting: *Deleted

## 2020-06-30 ENCOUNTER — Encounter: Payer: Self-pay | Admitting: *Deleted

## 2020-06-30 DIAGNOSIS — Z3042 Encounter for surveillance of injectable contraceptive: Secondary | ICD-10-CM | POA: Diagnosis not present

## 2020-06-30 MED ORDER — MEDROXYPROGESTERONE ACETATE 150 MG/ML IM SUSP
150.0000 mg | Freq: Once | INTRAMUSCULAR | Status: AC
  Administered 2020-06-30: 150 mg via INTRAMUSCULAR

## 2020-06-30 NOTE — Progress Notes (Signed)
   NURSE VISIT- INJECTION  SUBJECTIVE:  Jasmine Pierce is a 19 y.o. [phone] female here for a Depo Provera for contraception/period management. She is a GYN patient.   OBJECTIVE:  There were no vitals taken for this visit.  Appears well, in no apparent distress  Injection administered in: Right deltoid  Meds ordered this encounter  Medications  . medroxyPROGESTERone (DEPO-PROVERA) injection 150 mg    ASSESSMENT: GYN patient Depo Provera for contraception/period management PLAN: Follow-up: in 11-13 weeks for next Depo   Nance Pear  06/30/2020 4:20 PM

## 2020-08-12 ENCOUNTER — Other Ambulatory Visit: Payer: Self-pay | Admitting: Women's Health

## 2020-08-12 MED ORDER — LO LOESTRIN FE 1 MG-10 MCG / 10 MCG PO TABS: 1.0000 | ORAL_TABLET | Freq: Every day | ORAL | 3 refills | Status: DC

## 2020-08-19 ENCOUNTER — Other Ambulatory Visit: Payer: Self-pay | Admitting: Women's Health

## 2020-09-02 DIAGNOSIS — Z832 Family history of diseases of the blood and blood-forming organs and certain disorders involving the immune mechanism: Secondary | ICD-10-CM | POA: Diagnosis not present

## 2020-09-02 DIAGNOSIS — Z0000 Encounter for general adult medical examination without abnormal findings: Secondary | ICD-10-CM | POA: Diagnosis not present

## 2020-09-02 DIAGNOSIS — R6889 Other general symptoms and signs: Secondary | ICD-10-CM | POA: Diagnosis not present

## 2020-09-02 DIAGNOSIS — R5383 Other fatigue: Secondary | ICD-10-CM | POA: Diagnosis not present

## 2020-09-02 DIAGNOSIS — F32A Depression, unspecified: Secondary | ICD-10-CM | POA: Diagnosis not present

## 2020-09-02 DIAGNOSIS — Z111 Encounter for screening for respiratory tuberculosis: Secondary | ICD-10-CM | POA: Diagnosis not present

## 2020-09-02 DIAGNOSIS — Z23 Encounter for immunization: Secondary | ICD-10-CM | POA: Diagnosis not present

## 2020-09-02 DIAGNOSIS — Z1159 Encounter for screening for other viral diseases: Secondary | ICD-10-CM | POA: Diagnosis not present

## 2020-09-22 ENCOUNTER — Ambulatory Visit: Payer: Medicaid Other

## 2020-12-22 ENCOUNTER — Ambulatory Visit: Payer: Medicaid Other | Admitting: Women's Health

## 2021-01-09 ENCOUNTER — Ambulatory Visit: Payer: Medicaid Other | Admitting: Women's Health

## 2021-01-26 ENCOUNTER — Ambulatory Visit: Payer: Medicaid Other | Admitting: Women's Health

## 2021-08-19 ENCOUNTER — Encounter: Payer: Self-pay | Admitting: Women's Health

## 2021-09-02 NOTE — Progress Notes (Unsigned)
Visit was started but patient did not bring depo

## 2021-09-08 ENCOUNTER — Other Ambulatory Visit (HOSPITAL_COMMUNITY)
Admission: RE | Admit: 2021-09-08 | Discharge: 2021-09-08 | Disposition: A | Discharge status: Home / Self Care | Payer: Medicaid Other | Source: Ambulatory Visit | Attending: Adult Health | Admitting: Adult Health

## 2021-09-08 ENCOUNTER — Other Ambulatory Visit: Payer: Self-pay

## 2021-09-08 ENCOUNTER — Encounter: Payer: Self-pay | Admitting: Adult Health

## 2021-09-08 ENCOUNTER — Ambulatory Visit: Payer: Medicaid Other | Admitting: Adult Health

## 2021-09-08 VITALS — BP 109/76 | HR 78 | Ht 63.0 in | Wt 122.5 lb

## 2021-09-08 DIAGNOSIS — Z113 Encounter for screening for infections with a predominantly sexual mode of transmission: Secondary | ICD-10-CM | POA: Insufficient documentation

## 2021-09-08 DIAGNOSIS — Z319 Encounter for procreative management, unspecified: Secondary | ICD-10-CM | POA: Diagnosis not present

## 2021-09-08 NOTE — Progress Notes (Signed)
  Subjective:     Patient ID: Jasmine Pierce, female   DOB: [birth year], 20 y.o.   MRN: [id number]  HPI Marco is a 20 year old biracial female,married, [id number], in for GC/chlamydia testing and talk about getting pregnant. She has been trying for only 2 months. She has twin boys about 61 months old now.  Lab Results  Component Value Date   DIAGPAP  03/31/2020    - Negative for intraepithelial lesion or malignancy (NILM)    Review of Systems Patient denies any headaches, hearing loss, fatigue, blurred vision, shortness of breath, chest pain, abdominal pain, problems with bowel movements, urination, or intercourse. No joint pain or mood swings.    Reviewed past medical,surgical, social and family history. Reviewed medications and allergies.  Objective:   Physical Exam BP 109/76 (BP Location: Left Arm, Patient Position: Sitting, Cuff Size: Normal)   Pulse 78   Ht 5\' 3"  (1.6 m)   Wt 122 lb 8 oz (55.6 kg)   LMP 08/20/2021   Breastfeeding No   BMI 21.70 kg/m     Skin warm and dry.Pelvic: external genitalia is normal in appearance no lesions, vagina: scant discharge without odor,urethra has no lesions or masses noted, cervix:smooth, CV swab obtained,uterus: normal size, shape and contour, non tender, no masses felt, adnexa: no masses or tenderness noted. Bladder is non tender and no masses felt. I was in room with 13/07/2021 Np student, during the exam. Fall risk is low  [facility] - 09/08/21 1205       Pregnancy Intention Screening   Does the patient want to become pregnant in the next year? Yes    Does the patient's partner want to become pregnant in the next year? Yes    Would the patient like to discuss contraceptive options today? No      Contraception Wrap Up   Current Method Pregnant/Seeking Pregnancy    End Method Pregnant/Seeking Pregnancy             Assessment:     1. Patient desires pregnancy Take PNV Will check progesterone level 09/09/21 Have sex every other day,  days 7-24 of cycle and lay there after sex   2. Screening examination for STD (sexually transmitted disease) CV swab sent GC/CHL    Plan:     Follow up prn

## 2021-09-09 DIAGNOSIS — Z319 Encounter for procreative management, unspecified: Secondary | ICD-10-CM | POA: Diagnosis not present

## 2021-09-09 LAB — CERVICOVAGINAL ANCILLARY ONLY
Chlamydia: NEGATIVE
Comment: NEGATIVE
Comment: NORMAL
Neisseria Gonorrhea: NEGATIVE

## 2021-09-10 DIAGNOSIS — H5213 Myopia, bilateral: Secondary | ICD-10-CM | POA: Diagnosis not present

## 2021-09-10 LAB — PROGESTERONE: Progesterone: 14.4 ng/mL

## 2021-09-14 ENCOUNTER — Other Ambulatory Visit: Payer: Self-pay | Admitting: Adult Health

## 2021-09-14 DIAGNOSIS — Z3201 Encounter for pregnancy test, result positive: Secondary | ICD-10-CM | POA: Diagnosis not present

## 2021-09-14 NOTE — Progress Notes (Signed)
Ck QHCG  

## 2021-09-15 ENCOUNTER — Other Ambulatory Visit: Payer: Self-pay | Admitting: Adult Health

## 2021-09-15 DIAGNOSIS — Z3201 Encounter for pregnancy test, result positive: Secondary | ICD-10-CM

## 2021-09-15 LAB — BETA HCG QUANT (REF LAB): hCG Quant: 91 m[IU]/mL

## 2021-09-15 NOTE — Progress Notes (Signed)
Recheck QHCG

## 2021-09-16 DIAGNOSIS — Z3201 Encounter for pregnancy test, result positive: Secondary | ICD-10-CM | POA: Diagnosis not present

## 2021-09-17 LAB — BETA HCG QUANT (REF LAB): hCG Quant: 259 m[IU]/mL

## 2021-09-22 ENCOUNTER — Other Ambulatory Visit: Payer: Self-pay | Admitting: Adult Health

## 2021-09-22 ENCOUNTER — Encounter: Payer: Self-pay | Admitting: *Deleted

## 2021-09-22 MED ORDER — PROMETHAZINE HCL 25 MG PO TABS: 25.0000 mg | ORAL_TABLET | Freq: Four times a day (QID) | ORAL | 1 refills | Status: DC | PRN

## 2021-09-22 NOTE — Progress Notes (Signed)
Rx phenergan 

## 2021-10-02 ENCOUNTER — Other Ambulatory Visit: Payer: Self-pay | Admitting: Obstetrics & Gynecology

## 2021-10-02 DIAGNOSIS — R110 Nausea: Secondary | ICD-10-CM

## 2021-10-02 MED ORDER — DOXYLAMINE-PYRIDOXINE 10-10 MG PO TBEC: 2.0000 | DELAYED_RELEASE_TABLET | Freq: Every evening | ORAL | 4 refills | Status: AC

## 2021-10-11 NOTE — L&D Delivery Note (Cosign Needed Addendum)
LABOR COURSE SOL, epidural, expectant management, SROM.   Delivery Note Called to room and patient was complete and pushing. Head delivered ROA. No nuchal cord present. Shoulder and body delivered in usual fashion. At  2201 a viable female was delivered via Vaginal, Spontaneous .  Infant with spontaneous cry, placed on mother's abdomen, dried and stimulated. Cord clamped x 2 after 1-minute delay, and cut by FOB. Cord blood drawn. Placenta delivered spontaneously with gentle cord traction. Appears intact. Fundus firm with massage and Pitocin. Labia, perineum, vagina, and cervix inspected with hemostatic bilateral preurethral lacerations and hemostatic perineal laceration which did not require repair.    APGAR: 9, 10; weight pending .   Cord: 3VC   Anesthesia:  epidural Episiotomy: None Lacerations: hemostatic bilateral preurethral lacerations and hemostatic first degree perineal laceration  Est. Blood Loss (mL): 50  Mom to postpartum.  Baby to Couplet care / Skin to Skin.  Glendale Chard, DO 05/10/22 10:14 PM    Fellow ATTESTATION  I was present and gloved for this delivery and agree with the above documentation in the resident's note except as below.  Celedonio Savage, MD [facility] [employer] (Faculty Practice) 05/10/2022, 10:20 PM

## 2021-10-15 ENCOUNTER — Other Ambulatory Visit: Payer: Self-pay | Admitting: Obstetrics & Gynecology

## 2021-10-15 ENCOUNTER — Other Ambulatory Visit: Payer: Medicaid Other

## 2021-10-15 DIAGNOSIS — O3680X0 Pregnancy with inconclusive fetal viability, not applicable or unspecified: Secondary | ICD-10-CM

## 2021-10-16 ENCOUNTER — Other Ambulatory Visit: Payer: Self-pay

## 2021-10-16 ENCOUNTER — Ambulatory Visit (INDEPENDENT_AMBULATORY_CARE_PROVIDER_SITE_OTHER): Payer: Medicaid Other

## 2021-10-16 DIAGNOSIS — O3680X0 Pregnancy with inconclusive fetal viability, not applicable or unspecified: Secondary | ICD-10-CM

## 2021-10-16 DIAGNOSIS — Z3A08 8 weeks gestation of pregnancy: Secondary | ICD-10-CM

## 2021-10-16 NOTE — Progress Notes (Signed)
[place] 8+1 wks,single IUP with yolk sac,FHR 180 bpm,subchorionic hemorrhage 1.5 x 1.9 X .7 cm,normal ovaries,CRL 17.82 mm

## 2021-11-13 LAB — OB RESULTS CONSOLE RUBELLA ANTIBODY, IGM
Rubella: IMMUNE
Rubella: IMMUNE

## 2021-11-13 LAB — OB RESULTS CONSOLE HEPATITIS B SURFACE ANTIGEN: Hepatitis B Surface Ag: NEGATIVE

## 2021-11-13 LAB — OB RESULTS CONSOLE ABO/RH: RH Type: POSITIVE

## 2021-11-13 LAB — OB RESULTS CONSOLE RPR: RPR: NONREACTIVE

## 2021-11-13 LAB — OB RESULTS CONSOLE ANTIBODY SCREEN: Antibody Screen: NEGATIVE

## 2021-11-20 DIAGNOSIS — O09899 Supervision of other high risk pregnancies, unspecified trimester: Secondary | ICD-10-CM

## 2021-11-20 LAB — OB RESULTS CONSOLE GC/CHLAMYDIA
Chlamydia: NEGATIVE
Neisseria Gonorrhea: NEGATIVE

## 2022-02-26 LAB — OB RESULTS CONSOLE HGB/HCT, BLOOD
HCT: 37 (ref 29–41)
Hemoglobin: 12.2

## 2022-02-26 LAB — OB RESULTS CONSOLE PLATELET COUNT: Platelets: 218

## 2022-02-26 LAB — OB RESULTS CONSOLE RPR: RPR: NONREACTIVE

## 2022-02-26 LAB — GLUCOSE TOLERANCE, 1 HOUR (50G) W/O FASTING: Glucose, GTT - 1 Hour: 96 (ref ?–200)

## 2022-05-07 DIAGNOSIS — Z3490 Encounter for supervision of normal pregnancy, unspecified, unspecified trimester: Secondary | ICD-10-CM | POA: Insufficient documentation

## 2022-05-08 ENCOUNTER — Inpatient Hospital Stay (HOSPITAL_COMMUNITY)
Admission: AD | Admit: 2022-05-08 | Discharge: 2022-05-08 | Disposition: A | Discharge status: Home / Self Care | Payer: Medicaid Other | Attending: Obstetrics and Gynecology | Admitting: Obstetrics and Gynecology

## 2022-05-08 ENCOUNTER — Encounter (HOSPITAL_COMMUNITY): Payer: Self-pay | Admitting: Obstetrics and Gynecology

## 2022-05-08 DIAGNOSIS — O4693 Antepartum hemorrhage, unspecified, third trimester: Secondary | ICD-10-CM | POA: Diagnosis present

## 2022-05-08 DIAGNOSIS — O471 False labor at or after 37 completed weeks of gestation: Secondary | ICD-10-CM | POA: Diagnosis not present

## 2022-05-08 DIAGNOSIS — O368130 Decreased fetal movements, third trimester, not applicable or unspecified: Secondary | ICD-10-CM | POA: Insufficient documentation

## 2022-05-08 DIAGNOSIS — Z3A37 37 weeks gestation of pregnancy: Secondary | ICD-10-CM | POA: Insufficient documentation

## 2022-05-08 DIAGNOSIS — R109 Unspecified abdominal pain: Secondary | ICD-10-CM | POA: Diagnosis present

## 2022-05-08 DIAGNOSIS — Z3689 Encounter for other specified antenatal screening: Secondary | ICD-10-CM

## 2022-05-08 DIAGNOSIS — O26893 Other specified pregnancy related conditions, third trimester: Secondary | ICD-10-CM | POA: Diagnosis present

## 2022-05-08 MED ORDER — ZOLPIDEM TARTRATE 5 MG PO TABS
5.0000 mg | ORAL_TABLET | Freq: Once | ORAL | Status: AC
  Administered 2022-05-08: 5 mg via ORAL
  Filled 2022-05-08: qty 1

## 2022-05-08 MED ORDER — MORPHINE SULFATE (PF) 4 MG/ML IV SOLN
4.0000 mg | Freq: Once | INTRAVENOUS | Status: AC
  Administered 2022-05-08: 4 mg via INTRAMUSCULAR
  Filled 2022-05-08: qty 1

## 2022-05-08 NOTE — MAU Note (Signed)
.  Jasmine Pierce is a 21 y.o. at [redacted]w[redacted]d here in MAU reporting vaginal bleeding shortly before coming to hospital. Just moved here from [place]. On Weds. REports decreased FM for 2 days. Some ctxs since Weds  Onset of complaint: WEds Pain score: 8 Vitals:   05/08/22 0050  BP: 119/70  Pulse: 70  Resp: 17  Temp: 98 F (36.7 C)  SpO2: 99%     FHT:115 Lab orders placed from triage:  none

## 2022-05-08 NOTE — MAU Provider Note (Signed)
History     CSN: [id number]  Arrival date and time: 05/08/22 6644   Event Date/Time   First Provider Initiated Contact with Patient 05/08/22 0211      Chief Complaint  Patient presents with   Vaginal Bleeding   Abdominal Pain   Jasmine Pierce is a 21 y.o. [id number] at [redacted]w[redacted]d who recently moved from [place] and received care at [facility]. Sydnee Cabal.  She reports that hse has an appt on July 31st at [facility].  She presents today for Contractions and DFM.  She reports movement has decreased significantly for the past 2 days with movement felt only 2-3 times.  She reports trying to shake baby to promote movement. She reports contractions since Wednesday that are worse today around like 1500.  She endorses current fetal movement.     OB History     Gravida  3   Para  1   Term  0   Preterm  1   AB  1   Living  2      SAB  1   IAB  0   Ectopic  0   Multiple  1   Live Births  2           Past Medical History:  Diagnosis Date   Asthma    Miscarriage    Ovarian cyst    ruptured in 2016    Past Surgical History:  Procedure Laterality Date   CESAREAN SECTION MULTI-GESTATIONAL N/A 01/09/2020   Procedure: CESAREAN SECTION MULTI-GESTATIONAL;  Surgeon: Lazaro Arms, MD;  Location: [facility] LD [facility];  Service: Obstetrics;  Laterality: N/A;   NO PAST SURGERIES      Family History  Problem Relation Age of Onset   Hypertension Paternal Grandfather    Diabetes Paternal Grandfather    Diabetes Paternal Grandmother    Hypertension Paternal Grandmother    Hypertension Maternal Grandmother    Graves' disease Father    Sickle cell trait Mother    Asthma Maternal Aunt    Chronic Renal Failure Other    Cancer Other        cervical   Colon cancer Other     Social History   Tobacco Use   Smoking status: Former    Types: Cigarettes   Smokeless tobacco: Never   Tobacco comments:    Quit "months ago"   Vaping Use   Vaping Use: Former  Substance Use Topics   Alcohol use: No   Drug  use: No    Allergies: No Known Allergies  Medications Prior to Admission  Medication Sig Dispense Refill Last Dose   Prenatal Vit-Fe Fumarate-FA (PRENATAL VITAMIN PO) Take by mouth.   05/07/2022   promethazine (PHENERGAN) 25 MG tablet Take 1 tablet (25 mg total) by mouth every 6 (six) hours as needed for nausea or vomiting. 30 tablet 1     Review of Systems  Gastrointestinal:  Positive for abdominal pain (Contractions). Negative for nausea and vomiting.  Genitourinary:  Positive for vaginal bleeding (Red in toilet and on tissue.). Negative for difficulty urinating, dysuria and vaginal discharge.  Neurological:  Negative for dizziness, light-headedness and headaches.   Physical Exam   Blood pressure 111/72, pulse 72, temperature 98.5 F (36.9 C), temperature source Oral, resp. rate 16, height 5\' 3"  (1.6 m), weight 65.3 kg, last menstrual period 08/20/2021, SpO2 99 %.  Physical Exam Constitutional:      General: She is in acute distress (With contractions only\).     Appearance: Normal  appearance. She is well-developed.  HENT:     Head: Normocephalic and atraumatic.  Eyes:     Conjunctiva/sclera: Conjunctivae normal.  Cardiovascular:     Rate and Rhythm: Normal rate.  Pulmonary:     Effort: Pulmonary effort is normal. No respiratory distress.  Abdominal:     Comments: Gravid, appears AGA  Musculoskeletal:        General: Normal range of motion.     Cervical back: Normal range of motion.  Skin:    General: Skin is warm and dry.  Neurological:     Mental Status: She is alert and oriented to person, place, and time.  Psychiatric:        Mood and Affect: Mood normal.        Behavior: Behavior normal.     Dilation: 1 Cervical Position: Posterior Exam by:: Quintella Baton RNC  Fetal Assessment 125 bpm, Mod Var, -Decels, +Accels Toco: Q72min  MAU Course  No results found for this or any previous visit (from the past 24 hour(s)). No results found.  MDM PE Labs:  None EFM Assessment and Plan  21 year old G3P0112  SIUP at 37.2 weeks Cat I FT DFM Contractions  -Informed that provider is reassured with currently perceived fetal movement. -Patient visibly uncomfortable with contractions and encouraged to breathe as she is noted holding her breath. -Discussed cervical change and if no change plan for discharge. -Given option of pain meds, sleep aide, or both.  Patient desires both. -Questions regarding when to return addressed.  -NST reactive  Cherre Robins MSN, CNM 05/08/2022, 2:11 AM   Reassessment (3:10 AM) -Exam remains unchanged. -Therapeutic rest ordered; Morphine 4mg /Ambien 5mg . -NST remains reactive overall.  Variable noted, but patient likely d/t patient holding breath with contractions.  -Instructed to keep appt as scheduled. -Return precautions reiterated.  -Encouraged to call primary office or return to MAU if symptoms worsen or with the onset of new symptoms. -Discharged to home in stable condition.  MSN, CNM Advanced Practice Provider, Center for 

## 2022-05-09 ENCOUNTER — Encounter (HOSPITAL_COMMUNITY): Payer: Self-pay | Admitting: Obstetrics and Gynecology

## 2022-05-09 ENCOUNTER — Inpatient Hospital Stay (EMERGENCY_DEPARTMENT_HOSPITAL)
Admission: AD | Admit: 2022-05-09 | Discharge: 2022-05-09 | Disposition: A | Discharge status: Home / Self Care | Payer: Medicaid Other | Source: Home / Self Care | Attending: Obstetrics and Gynecology | Admitting: Obstetrics and Gynecology

## 2022-05-09 DIAGNOSIS — O471 False labor at or after 37 completed weeks of gestation: Secondary | ICD-10-CM | POA: Insufficient documentation

## 2022-05-09 DIAGNOSIS — Z3689 Encounter for other specified antenatal screening: Secondary | ICD-10-CM

## 2022-05-09 DIAGNOSIS — Z3A37 37 weeks gestation of pregnancy: Secondary | ICD-10-CM | POA: Insufficient documentation

## 2022-05-09 NOTE — MAU Provider Note (Signed)
S: Ms. Jasmine Pierce is a 21 y.o. [phone] at [redacted]w[redacted]d  who presents to MAU today for labor evaluation.   Nurse reports patient without cervical change after 1.5 hours.   Cervical exam by RN:  Dilation: 1.5 Effacement (%): 70 Cervical Position: Middle Station: -2 Presentation: Vertex Exam by:: Felipa Furnace, RN  Fetal Monitoring: Baseline: 130 Variability: Moderate Accelerations: Present Decelerations: Variable noted Contractions: Q6-34min  MDM Discussed patient with RN. NST reviewed.   A: SIUP at [redacted]w[redacted]d  False labor  P: Variable noted with contraction, but EFM overall reassuring and reactive.  Discharge home Labor precautions and kick counts included in AVS Patient to follow-up with primary office as scheduled  Patient may return to MAU as needed or when in labor   Gerrit Heck, [place] 05/09/2022 6:59 AM

## 2022-05-09 NOTE — MAU Note (Signed)
..  Jasmine Pierce is a 21 y.o. at [redacted]w[redacted]d here in MAU reporting: Irregular contractions since 7pm. Reports some vaginal brown blood from her cervical exams yesterday. Denies leaking of fluid. +FM  Was 1.5cm yesterday.   Pain score: 7/10 Vitals:   05/09/22 0449  BP: 105/63  Pulse: 73  Resp: 15  Temp: 97.9 F (36.6 C)  SpO2: 99%     FHT:131

## 2022-05-10 ENCOUNTER — Encounter: Payer: Self-pay | Admitting: Women's Health

## 2022-05-10 ENCOUNTER — Ambulatory Visit: Payer: Medicaid Other | Admitting: *Deleted

## 2022-05-10 ENCOUNTER — Other Ambulatory Visit: Payer: Self-pay | Admitting: Women's Health

## 2022-05-10 ENCOUNTER — Inpatient Hospital Stay (HOSPITAL_COMMUNITY)
Admission: AD | Admit: 2022-05-10 | Discharge: 2022-05-12 | DRG: 807 | Disposition: A | Discharge status: Home / Self Care | Payer: Medicaid Other | Attending: Family Medicine | Admitting: Family Medicine

## 2022-05-10 ENCOUNTER — Other Ambulatory Visit: Payer: Self-pay

## 2022-05-10 ENCOUNTER — Inpatient Hospital Stay (HOSPITAL_COMMUNITY): Payer: Medicaid Other | Admitting: Anesthesiology

## 2022-05-10 ENCOUNTER — Ambulatory Visit (INDEPENDENT_AMBULATORY_CARE_PROVIDER_SITE_OTHER): Payer: Medicaid Other | Admitting: Women's Health

## 2022-05-10 ENCOUNTER — Encounter (HOSPITAL_COMMUNITY): Payer: Self-pay | Admitting: Family Medicine

## 2022-05-10 ENCOUNTER — Other Ambulatory Visit (HOSPITAL_COMMUNITY)
Admission: RE | Admit: 2022-05-10 | Discharge: 2022-05-10 | Disposition: A | Discharge status: Home / Self Care | Payer: Medicaid Other | Source: Ambulatory Visit

## 2022-05-10 VITALS — BP 104/71 | HR 87 | Wt 146.0 lb

## 2022-05-10 DIAGNOSIS — Z3480 Encounter for supervision of other normal pregnancy, unspecified trimester: Secondary | ICD-10-CM

## 2022-05-10 DIAGNOSIS — Z98891 History of uterine scar from previous surgery: Secondary | ICD-10-CM | POA: Diagnosis not present

## 2022-05-10 DIAGNOSIS — Z113 Encounter for screening for infections with a predominantly sexual mode of transmission: Secondary | ICD-10-CM | POA: Diagnosis not present

## 2022-05-10 DIAGNOSIS — Z3483 Encounter for supervision of other normal pregnancy, third trimester: Secondary | ICD-10-CM

## 2022-05-10 DIAGNOSIS — Z87891 Personal history of nicotine dependence: Secondary | ICD-10-CM | POA: Diagnosis not present

## 2022-05-10 DIAGNOSIS — O99824 Streptococcus B carrier state complicating childbirth: Secondary | ICD-10-CM | POA: Diagnosis not present

## 2022-05-10 DIAGNOSIS — J45909 Unspecified asthma, uncomplicated: Secondary | ICD-10-CM | POA: Diagnosis not present

## 2022-05-10 DIAGNOSIS — O9952 Diseases of the respiratory system complicating childbirth: Secondary | ICD-10-CM | POA: Diagnosis not present

## 2022-05-10 DIAGNOSIS — Z3A37 37 weeks gestation of pregnancy: Secondary | ICD-10-CM

## 2022-05-10 DIAGNOSIS — Z3490 Encounter for supervision of normal pregnancy, unspecified, unspecified trimester: Secondary | ICD-10-CM

## 2022-05-10 DIAGNOSIS — O34219 Maternal care for unspecified type scar from previous cesarean delivery: Principal | ICD-10-CM | POA: Diagnosis present

## 2022-05-10 DIAGNOSIS — O34211 Maternal care for low transverse scar from previous cesarean delivery: Secondary | ICD-10-CM | POA: Diagnosis not present

## 2022-05-10 DIAGNOSIS — O09899 Supervision of other high risk pregnancies, unspecified trimester: Secondary | ICD-10-CM

## 2022-05-10 DIAGNOSIS — O26893 Other specified pregnancy related conditions, third trimester: Secondary | ICD-10-CM | POA: Diagnosis present

## 2022-05-10 LAB — CBC
HCT: 36.7 % (ref 36.0–46.0)
Hemoglobin: 12.6 g/dL (ref 12.0–15.0)
MCH: 28.1 pg (ref 26.0–34.0)
MCHC: 34.3 g/dL (ref 30.0–36.0)
MCV: 81.9 fL (ref 80.0–100.0)
Platelets: 245 10*3/uL (ref 150–400)
RBC: 4.48 MIL/uL (ref 3.87–5.11)
RDW: 13.4 % (ref 11.5–15.5)
WBC: 9.5 10*3/uL (ref 4.0–10.5)
nRBC: 0 % (ref 0.0–0.2)

## 2022-05-10 LAB — RAPID HIV SCREEN (HIV 1/2 AB+AG)
HIV 1/2 Antibodies: NONREACTIVE
HIV-1 P24 Antigen - HIV24: NONREACTIVE

## 2022-05-10 LAB — TYPE AND SCREEN
ABO/RH(D): A POS
Antibody Screen: NEGATIVE

## 2022-05-10 MED ORDER — EPHEDRINE 5 MG/ML INJ: 10.0000 mg | INTRAVENOUS | Status: DC | PRN

## 2022-05-10 MED ORDER — LACTATED RINGERS IV SOLN
500.0000 mL | Freq: Once | INTRAVENOUS | Status: AC
  Administered 2022-05-10: 500 mL via INTRAVENOUS

## 2022-05-10 MED ORDER — BUPIVACAINE HCL (PF) 0.25 % IJ SOLN
INTRAMUSCULAR | Status: DC | PRN
  Administered 2022-05-10: 1 mL via INTRATHECAL

## 2022-05-10 MED ORDER — DIPHENHYDRAMINE HCL 50 MG/ML IJ SOLN
12.5000 mg | INTRAMUSCULAR | Status: DC | PRN
  Administered 2022-05-10: 12.5 mg via INTRAVENOUS
  Filled 2022-05-10: qty 1

## 2022-05-10 MED ORDER — PHENYLEPHRINE 80 MCG/ML (10ML) SYRINGE FOR IV PUSH (FOR BLOOD PRESSURE SUPPORT)
80.0000 ug | PREFILLED_SYRINGE | INTRAVENOUS | Status: DC | PRN
  Filled 2022-05-10 (×2): qty 10

## 2022-05-10 MED ORDER — PENICILLIN G POT IN DEXTROSE 60000 UNIT/ML IV SOLN
3.0000 10*6.[IU] | INTRAVENOUS | Status: DC
  Administered 2022-05-10: 3 10*6.[IU] via INTRAVENOUS
  Filled 2022-05-10 (×2): qty 50

## 2022-05-10 MED ORDER — OXYTOCIN-SODIUM CHLORIDE 30-0.9 UT/500ML-% IV SOLN
2.5000 [IU]/h | INTRAVENOUS | Status: DC
  Administered 2022-05-10: 2.5 [IU]/h via INTRAVENOUS
  Filled 2022-05-10: qty 500

## 2022-05-10 MED ORDER — ACETAMINOPHEN 325 MG PO TABS
650.0000 mg | ORAL_TABLET | ORAL | Status: DC | PRN
  Administered 2022-05-10: 650 mg via ORAL
  Filled 2022-05-10: qty 2

## 2022-05-10 MED ORDER — FENTANYL CITRATE (PF) 100 MCG/2ML IJ SOLN
100.0000 ug | INTRAMUSCULAR | Status: DC | PRN
  Administered 2022-05-10: 100 ug via INTRAVENOUS
  Filled 2022-05-10: qty 2

## 2022-05-10 MED ORDER — SODIUM CHLORIDE 0.9 % IV SOLN
5.0000 10*6.[IU] | Freq: Once | INTRAVENOUS | Status: AC
  Administered 2022-05-10: 5 10*6.[IU] via INTRAVENOUS
  Filled 2022-05-10: qty 5

## 2022-05-10 MED ORDER — ONDANSETRON HCL 4 MG/2ML IJ SOLN
4.0000 mg | Freq: Four times a day (QID) | INTRAMUSCULAR | Status: DC | PRN
  Administered 2022-05-10: 4 mg via INTRAVENOUS
  Filled 2022-05-10: qty 2

## 2022-05-10 MED ORDER — FENTANYL-BUPIVACAINE-NACL 0.5-0.125-0.9 MG/250ML-% EP SOLN
12.0000 mL/h | EPIDURAL | Status: DC | PRN
  Administered 2022-05-10: 10 mL/h via EPIDURAL
  Filled 2022-05-10: qty 250

## 2022-05-10 MED ORDER — LIDOCAINE HCL (PF) 1 % IJ SOLN: 30.0000 mL | INTRAMUSCULAR | Status: DC | PRN

## 2022-05-10 MED ORDER — LACTATED RINGERS IV SOLN: INTRAVENOUS | Status: DC

## 2022-05-10 MED ORDER — LACTATED RINGERS IV SOLN
500.0000 mL | INTRAVENOUS | Status: DC | PRN
  Administered 2022-05-10: 500 mL via INTRAVENOUS

## 2022-05-10 MED ORDER — SOD CITRATE-CITRIC ACID 500-334 MG/5ML PO SOLN: 30.0000 mL | ORAL | Status: DC | PRN

## 2022-05-10 MED ORDER — OXYTOCIN BOLUS FROM INFUSION
333.0000 mL | Freq: Once | INTRAVENOUS | Status: AC
  Administered 2022-05-10: 333 mL via INTRAVENOUS

## 2022-05-10 MED ORDER — OXYCODONE-ACETAMINOPHEN 5-325 MG PO TABS: 1.0000 | ORAL_TABLET | ORAL | Status: DC | PRN

## 2022-05-10 MED ORDER — PHENYLEPHRINE 80 MCG/ML (10ML) SYRINGE FOR IV PUSH (FOR BLOOD PRESSURE SUPPORT)
80.0000 ug | PREFILLED_SYRINGE | INTRAVENOUS | Status: DC | PRN
  Administered 2022-05-10: 80 ug via INTRAVENOUS

## 2022-05-10 MED ORDER — OXYCODONE-ACETAMINOPHEN 5-325 MG PO TABS: 2.0000 | ORAL_TABLET | ORAL | Status: DC | PRN

## 2022-05-10 NOTE — Lactation Note (Signed)
This note was copied from a baby's chart. Lactation Consultation Note  Patient Name: Jasmine Pierce Today's Date: 05/10/2022 Reason for consult: L&D Initial assessment;Early term 37-38.6wks Age:23 hours  Parent holding infant, cueing, attempting to latch.  LC offered assistance with mom nursing in cradle hold. Baby was unable to stay latch but latched easily in laid back position and fed with a wide gape, good rhythmic sucking, with swallows.  States she leaked during pregnancy.  Parents are excited baby is latching.  Questions and concerns addressed.   Encouraged STS, feed with cues, hand express and call out for assistance if needed.   Has patient been taught Hand Expression?: Yes Does the patient have breastfeeding experience prior to this delivery?: Yes How long did the patient breastfeed?: twin boys, BF 2 months  Feeding Mother's Current Feeding Choice: Breast Milk  LATCH Score Latch: Repeated attempts needed to sustain latch, nipple held in mouth throughout feeding, stimulation needed to elicit sucking reflex.  Audible Swallowing: Spontaneous and intermittent  Type of Nipple: Everted at rest and after stimulation  Comfort (Breast/Nipple): Soft / non-tender  Hold (Positioning): Assistance needed to correctly position infant at breast and maintain latch.  LATCH Score: 8   Lactation Tools Discussed/Used    Interventions Interventions: Breast feeding basics reviewed;Skin to skin;Hand express;Adjust position;Support pillows;Education  Discharge Pump: Personal (mom cozy)  Consult Status Consult Status: Follow-up from L&D Date: 05/11/22 Follow-up type: In-patient    Maryruth Hancock [facility] 05/10/2022, 10:59 PM

## 2022-05-10 NOTE — Anesthesia Procedure Notes (Signed)
Epidural Patient location during procedure: OB Start time: 05/10/2022 5:24 PM End time: 05/10/2022 5:27 PM  Staffing Anesthesiologist: Kaylyn Layer, MD Performed: anesthesiologist   Preanesthetic Checklist Completed: patient identified, IV checked, risks and benefits discussed, monitors and equipment checked, pre-op evaluation and timeout performed  Epidural Patient position: sitting Prep: DuraPrep and site prepped and draped Patient monitoring: heart rate, continuous pulse ox and blood pressure Approach: midline Location: L3-L4 Injection technique: LOR air  Needle:  Needle type: Tuohy  Needle gauge: 17 G Needle length: 9 cm Needle insertion depth: 5 cm Catheter type: closed end flexible Catheter size: 19 Gauge Catheter at skin depth: 10 cm  Assessment Events: blood not aspirated, injection not painful, no injection resistance, no paresthesia and negative IV test  Additional Notes Patient identified. Risks, benefits, and alternatives discussed with patient including but not limited to bleeding, infection, nerve damage, paralysis, failed block, incomplete pain control, headache, blood pressure changes, nausea, vomiting, reactions to medication, itching, and postpartum back pain. Confirmed with bedside nurse the patient's most recent platelet count. Confirmed with patient that they are not currently taking any anticoagulation, have any bleeding history, or any family history of bleeding disorders. Patient expressed understanding and wished to proceed. All questions were answered. Sterile technique was used throughout the entire procedure. Please see nursing notes for vital signs.   Crisp LOR with Tuohy needle on first pass. Whitacre 25g spinal needle introduced through Tuohy with clear CSF return prior to injection of intrathecal medication. Spinal needle withdrawn and epidural catheter threaded easily. Negative aspiration of catheter for heme or CSF prior to starting  epidural infusion. Warning signs of high block given to the patient including shortness of breath, tingling/numbness in hands, complete motor block, or any concerning symptoms with instructions to call for help. Patient was given instructions on fall risk and not to get out of bed. All questions and concerns addressed with instructions to call with any issues or inadequate analgesia.  Patient comfortable with contractions prior to my leaving room. Reason for block:procedure for pain

## 2022-05-10 NOTE — H&P (Signed)
OBSTETRIC ADMISSION HISTORY AND PHYSICAL  Jasmine Pierce is a 21 y.o. female [phone] with IUP at [redacted]w[redacted]d by LMP presenting for Labor. She reports +FMs, No LOF, no VB, no blurry vision, headaches or peripheral edema, and RUQ pain.  She plans on breast feeding. She request depo for birth control. She received her prenatal c are at [facility]   Dating: By [place] --->  Estimated Date of Delivery: 05/27/22  Sono:   In [place] normal female anatomy   Prenatal History/Complications: H/o c/s for twin gestation @ 32.5wks in 2021  Past Medical History: Past Medical History:  Diagnosis Date   Asthma    Miscarriage    Ovarian cyst    ruptured in 2016    Past Surgical History: Past Surgical History:  Procedure Laterality Date   CESAREAN SECTION MULTI-GESTATIONAL N/A 01/09/2020   Procedure: CESAREAN SECTION MULTI-GESTATIONAL;  Surgeon: Lazaro Arms, MD;  Location: [facility] LD [facility]S;  Service: Obstetrics;  Laterality: N/A;   NO PAST SURGERIES      Obstetrical History: OB History     Gravida  3   Para  1   Term  0   Preterm  1   AB  1   Living  2      SAB  1   IAB  0   Ectopic  0   Multiple  1   Live Births  2           Social History Social History   Socioeconomic History   Marital status: Married    Spouse name: Not on file   Number of children: 2   Years of education: Not on file   Highest education level: High school graduate  Occupational History   Occupation: [employer]  Tobacco Use   Smoking status: Former    Types: Cigarettes   Smokeless tobacco: Never   Tobacco comments:    Quit "months ago"   Building services engineer Use: Former  Substance and Sexual Activity   Alcohol use: No   Drug use: No   Sexual activity: Yes    Birth control/protection: None  Other Topics Concern   Not on file  Social History Narrative   Not on file   Social Determinants of Health   Financial Resource Strain: Low Risk  (05/10/2022)   Overall Financial Resource Strain  (CARDIA)    Difficulty of Paying Living Expenses: Not hard at all  Food Insecurity: No Food Insecurity (05/10/2022)   Hunger Vital Sign    Worried About Running Out of Food in the Last Year: Never true    Ran Out of Food in the Last Year: Never true  Transportation Needs: No Transportation Needs (05/10/2022)   PRAPARE - Administrator, Civil Service (Medical): No    Lack of Transportation (Non-Medical): No  Physical Activity: Insufficiently Active (05/10/2022)   Exercise Vital Sign    Days of Exercise per Week: 2 days    Minutes of Exercise per Session: 10 min  Stress: No Stress Concern Present (05/10/2022)   [employer] of Occupational Health - Occupational Stress Questionnaire    Feeling of Stress : Only a little  Social Connections: Socially Isolated (05/10/2022)   Social Connection and Isolation Panel [NHANES]    Frequency of Communication with Friends and Family: Twice a week    Frequency of Social Gatherings with Friends and Family: Never    Attends Religious Services: Never    Production manager of [employer]  or Organizations: No    Attends Banker Meetings: Never    Marital Status: Married    Family History: Family History  Problem Relation Age of Onset   Hypertension Paternal Grandfather    Diabetes Paternal Grandfather    Diabetes Paternal Grandmother    Hypertension Paternal Grandmother    Hypertension Maternal Grandmother    Graves' disease Father    Sickle cell trait Mother    Asthma Maternal Aunt    Chronic Renal Failure Other    Cancer Other        cervical   Colon cancer Other     Allergies: No Known Allergies  Medications Prior to Admission  Medication Sig Dispense Refill Last Dose   Prenatal Vit-Fe Fumarate-FA (PRENATAL VITAMIN PO) Take by mouth.      promethazine (PHENERGAN) 25 MG tablet Take 1 tablet (25 mg total) by mouth every 6 (six) hours as needed for nausea or vomiting. (Patient not taking: Reported on 05/10/2022) 30 tablet 1       Review of Systems   All systems reviewed and negative except as stated in HPI  Blood pressure 129/76, pulse 69, resp. rate 18, height 5\' 3"  (1.6 m), weight 66.2 kg, last menstrual period 08/20/2021. General appearance: alert, cooperative, and appears stated age Lungs: clear to auscultation bilaterally Heart: regular rate and rhythm Abdomen: soft, non-tender; bowel sounds normal Pelvic: SVE 5.5/80/-2 Extremities: Homans sign is negative, no sign of DVT Presentation: cephalic Fetal monitoringBaseline: 130 bpm, Variability: Good {> 6 bpm), Accelerations: Reactive, and Decelerations: Absent Uterine activityFrequency: Every 3-4 minutes Dilation: 5 Effacement (%): 80 Station: -2 Exam by:: [email or web address] jones RN   Prenatal labs: ABO, Rh: A/Positive/-- (02/03 0000) Antibody: Negative (02/03 0000) Rubella: Immune, Immune (02/03 0000) RPR: Nonreactive (05/19 0000)  HBsAg: Negative (02/03 0000)  HIV:    GBS:    1 hr Glucola passed Genetic screening  nml Anatomy 05-20-2001 nml  Prenatal Transfer Tool  Maternal Diabetes: No Genetic Screening: Normal Maternal Ultrasounds/Referrals: Normal Fetal Ultrasounds or other Referrals:  None Maternal Substance Abuse:  No Significant Maternal Medications:  None Significant Maternal Lab Results: Group B Strep positive  No results found for this or any previous visit (from the past 24 hour(s)).  Patient Active Problem List   Diagnosis Date Noted   Labor and delivery, indication for care 05/10/2022   Encounter for supervision of normal pregnancy, antepartum 05/07/2022   S/P cesarean section 01/09/2020   Hemorrhoids during pregnancy 11/09/2019   Asthma 1999[birth year]08[birth year]19    Assessment/Plan:  Jasmine Pierce is a 21 y.o. 30 at [redacted]w[redacted]d here for Labor  #Labor:SVE 5/80/-2, changed from 4 cm. Will provide expectant management #Pain: IV pain meds prn, epidural upon request #FWB: CAT 1 #ID:  GBS pos, PCN started #MOF: Breast #MOC: Depo provera #Circ:   YES,  needs consent  11-23-2004, DO  05/10/2022, 1:54 PM

## 2022-05-10 NOTE — Discharge Summary (Signed)
Postpartum Discharge Summary  Date of Service updated***     Patient Name: Jasmine Pierce DOB: 02-11-2001 MRN: [id number]  Date of admission: 05/10/2022 Delivery date:05/10/2022  Delivering provider: Concepcion Living  Date of discharge: 05/10/2022  Admitting diagnosis: Labor and delivery, indication for care [O75.9] Intrauterine pregnancy: [redacted]w[redacted]d     Secondary diagnosis:  Principal Problem:   Labor and delivery, indication for care Active Problems:   Encounter for supervision of normal pregnancy, antepartum   Hx of preterm delivery, currently pregnant   VBAC, delivered  Additional problems: GBS +    Discharge diagnosis: Term Pregnancy Delivered and VBAC                                              Post partum procedures:{Postpartum procedures:23558} Augmentation:  None Complications: None  Hospital course: Onset of Labor With Vaginal Delivery      21 y.o. yo [id number] at [redacted]w[redacted]d was admitted in Active Labor on 05/10/2022. Patient had an uncomplicated labor course as follows:  Membrane Rupture Time/Date: 9:24 PM ,05/10/2022   Delivery Method:VBAC, Spontaneous  Episiotomy: None  Lacerations:  None  Patient had an uncomplicated postpartum course.  She is ambulating, tolerating a regular diet, passing flatus, and urinating well. Patient is discharged home in stable condition on 05/10/22.  Newborn Data: Birth date:05/10/2022  Birth time:10:01 PM  Gender:Female  Living status:Living  Apgars:9 ,10  Weight:   Magnesium Sulfate received: No BMZ received: No Rhophylac:N/A MMR:{MMR:30440033} T-DaP:{Tdap:23962} Flu: {JJH:41740} Transfusion:{Transfusion received:30440034}  Physical exam  Vitals:   05/10/22 2100 05/10/22 2130 05/10/22 2215 05/10/22 2230  BP: (!) 95/46 (!) 108/59 (!) 105/31 (!) 111/56  Pulse: (!) 49 73 (!) 233 (!) 107  Resp: $Remo'14  16 16  'HobOr$ Temp:      TempSrc:      SpO2:      Weight:      Height:       General: {Exam; general:21111117} Lochia: {Desc;  appropriate/inappropriate:30686::"appropriate"} Uterine Fundus: {Desc; firm/soft:30687} Incision: {Exam; incision:21111123} DVT Evaluation: {Exam; dvt:2111122} Labs: Lab Results  Component Value Date   WBC 9.5 05/10/2022   HGB 12.6 05/10/2022   HCT 36.7 05/10/2022   MCV 81.9 05/10/2022   PLT 245 05/10/2022      Latest Ref Rng & Units 12/24/2019    9:51 AM  CMP  Glucose 65 - 99 mg/dL 73   BUN 6 - 20 mg/dL 11   Creatinine 0.57 - 1.00 mg/dL 0.65   Sodium 134 - 144 mmol/L 136   Potassium 3.5 - 5.2 mmol/L 4.3   Chloride 96 - 106 mmol/L 102   CO2 20 - 29 mmol/L 20   Calcium 8.7 - 10.2 mg/dL 9.0   Total Protein 6.0 - 8.5 g/dL 6.3   Total Bilirubin 0.0 - 1.2 mg/dL 0.2   Alkaline Phos 39 - 117 IU/L 132   AST 0 - 40 IU/L 27   ALT 0 - 32 IU/L 14    Edinburgh Score:    02/26/2020   11:23 AM  Edinburgh Postnatal Depression Scale Screening Tool  I have been able to laugh and see the funny side of things. 1  I have looked forward with enjoyment to things. 0  I have blamed myself unnecessarily when things went wrong. 0  I have been anxious or worried for no good reason. 0  I have felt scared  or panicky for no good reason. 0  Things have been getting on top of me. 1  I have been so unhappy that I have had difficulty sleeping. 0  I have felt sad or miserable. 0  I have been so unhappy that I have been crying. 1  The thought of harming myself has occurred to me. 0  Edinburgh Postnatal Depression Scale Total 3     After visit meds:  Allergies as of 05/10/2022   No Known Allergies   Med Rec must be completed prior to using this [facility]***        Discharge home in stable condition Infant Feeding: Breast Infant Disposition:home with mother Discharge instruction: per After Visit Summary and Postpartum booklet. Activity: Advance as tolerated. Pelvic rest for 6 weeks.  Diet: routine diet Future Appointments:No future appointments. Follow up Visit:  Message sent by Gifford Shave on 05/10/22.  Please schedule this patient for a In person postpartum visit in 6 weeks with the following provider: Any provider. Additional Postpartum F/U: none   Low risk pregnancy complicated by:  None Delivery mode:  VBAC, Spontaneous  Anticipated Birth Control:  Depo   05/10/2022 Concepcion Living, MD

## 2022-05-10 NOTE — Progress Notes (Signed)
Labor Progress Note Jasmine Pierce is a 21 y.o. [id number] at [redacted]w[redacted]d presented for SOL  S: Increasing pressure and contractions. Pain increasing as well  O:  BP (!) 91/52   Pulse 63   Temp 98 F (36.7 C) (Oral)   Resp 16   Ht 5\' 3"  (1.6 m)   Wt 66.2 kg   LMP 08/20/2021   SpO2 99%   BMI 25.86 kg/m  EFM: 130bpm/Mod/+accels 15x15/no decels  CVE: Dilation: 8 Effacement (%): 80 Station: -2 Presentation: Vertex Exam by:: [email or web address] RN   A&P: 21 y.o. 30 [redacted]w[redacted]d SOL #Labor: Progressing well. Pt want to wait on AROM at this time #Pain: IV pain meds, Epidural upon request. #FWB: CAT 1 #GBS positive, PCN x2   [redacted]w[redacted]d, DO 7:53 PM

## 2022-05-10 NOTE — Anesthesia Preprocedure Evaluation (Signed)
Anesthesia Evaluation  Patient identified by MRN, date of birth, ID band Patient awake    Reviewed: Allergy & Precautions, Patient's Chart, lab work & pertinent test results  History of Anesthesia Complications Negative for: history of anesthetic complications  Airway Mallampati: II  TM Distance: >3 FB Neck ROM: Full    Dental no notable dental hx.    Pulmonary asthma , former smoker,    Pulmonary exam normal        Cardiovascular negative cardio ROS Normal cardiovascular exam     Neuro/Psych negative neurological ROS  negative psych ROS   GI/Hepatic negative GI ROS, Neg liver ROS,   Endo/Other  negative endocrine ROS  Renal/GU negative Renal ROS  negative genitourinary   Musculoskeletal negative musculoskeletal ROS (+)   Abdominal   Peds  Hematology negative hematology ROS (+)   Anesthesia Other Findings Day of surgery medications reviewed with patient.  Reproductive/Obstetrics (+) Pregnancy (TOLAC)                             Anesthesia Physical Anesthesia Plan  ASA: 3  Anesthesia Plan: Epidural   Post-op Pain Management:    Induction:   PONV Risk Score and Plan: Treatment may vary due to age or medical condition  Airway Management Planned: Natural Airway  Additional Equipment: Fetal Monitoring  Intra-op Plan:   Post-operative Plan:   Informed Consent: I have reviewed the patients History and Physical, chart, labs and discussed the procedure including the risks, benefits and alternatives for the proposed anesthesia with the patient or authorized representative who has indicated his/her understanding and acceptance.       Plan Discussed with:   Anesthesia Plan Comments:         Anesthesia Quick Evaluation

## 2022-05-10 NOTE — Patient Instructions (Signed)
Kendal, thank you for choosing our office today! We appreciate the opportunity to meet your healthcare needs. You may receive a short survey by mail, e-mail, or through [employer]. If you are happy with your care we would appreciate if you could take just a few minutes to complete the survey questions. We read all of your comments and take your feedback very seriously. Thank you again for choosing our office.  Center for [employer] Team at [facility]  [facility] at [facility] ([address] [place], [place] [address]) Entrance C, located off of [address] Free 24/7 valet parking   CLASSES: Go to [email or web address] to register for classes (childbirth, breastfeeding, waterbirth, infant CPR, daddy bootcamp, etc.)  Call the office [phone]) or go to [facility] if: You begin to have strong, frequent contractions Your water breaks.  Sometimes it is a big gush of fluid, sometimes it is just a trickle that keeps getting your panties wet or running down your legs You have vaginal bleeding.  It is normal to have a small amount of spotting if your cervix was checked.  You don't feel your baby moving like normal.  If you don't, get you something to eat and drink and lay down and focus on feeling your baby move.   If your baby is still not moving like normal, you should call the office or go to [facility].  Call the office [phone]) or go to [facility] hospital for these signs of pre-eclampsia: Severe headache that does not go away with Tylenol Visual changes- seeing spots, double, blurred vision Pain under your right breast or upper abdomen that does not go away with Tums or heartburn medicine Nausea and/or vomiting Severe swelling in your hands, feet, and face   [facility] Pediatricians/Family Doctors [facility]): [address] Colette Ribas, [phone]           [facility]: [address], [phone]                 [facility]): [address], [phone] (call to ask if accepting patients) [facility]: [address], [place], [phone]    [facility] Pediatricians/Family Doctors [facility]): [address], [phone] [facility]: [address] [place], [phone] [facility]: [address], [phone]  [facility] Doctors  [facility]): [phone] [facility]: [address], [phone]   [facility] Doctors [facility]: [address], [phone]  [facility] Doctors  [facility]: [phone], [phone]  Home Blood Pressure Monitoring for Patients   Your provider has recommended that you check your blood pressure (BP) at least once a week at home. If you do not have a blood pressure cuff at home, one will be provided for you. Contact your provider if you have not received your monitor within 1 week.   Helpful Tips for Accurate Home Blood Pressure Checks  Don't smoke, exercise, or drink caffeine 30 minutes before checking your BP Use the restroom before checking your BP (a full bladder can raise your pressure) Relax in a comfortable upright chair Feet on the ground Left arm resting comfortably on a flat surface at the level of your heart Legs uncrossed Back supported Sit quietly and don't talk Place the cuff on your bare arm Adjust snuggly, so that only two fingertips  can fit between your skin and the top of the cuff Check 2 readings separated by at least one minute Keep a log of your BP readings For a visual, please reference this diagram: http://ccnc.care/bpdiagram  Provider Name: [facility]     Phone: [phone]  Zone 1: ALL CLEAR  Continue to monitor your symptoms:  BP reading is less than 140 (top number) or less than 90 (bottom number)  No right  upper stomach pain No headaches or seeing spots No feeling nauseated or throwing up No swelling in face and hands  Zone 2: CAUTION Call your doctor's office for any of the following:  BP reading is greater than 140 (top number) or greater than 90 (bottom number)  Stomach pain under your ribs in the middle or right side Headaches or seeing spots Feeling nauseated or throwing up Swelling in face and hands  Zone 3: EMERGENCY  Seek immediate medical care if you have any of the following:  BP reading is greater than160 (top number) or greater than 110 (bottom number) Severe headaches not improving with Tylenol Serious difficulty catching your breath Any worsening symptoms from Zone 2   Braxton Hicks Contractions Contractions of the uterus can occur throughout pregnancy, but they are not always a sign that you are in labor. You may have practice contractions called Braxton Hicks contractions. These false labor contractions are sometimes confused with true labor. What are [place] contractions? Braxton Hicks contractions are tightening movements that occur in the muscles of the uterus before labor. Unlike true labor contractions, these contractions do not result in opening (dilation) and thinning of the cervix. Toward the end of pregnancy (32-34 weeks), Braxton Hicks contractions can happen more often and may become stronger. These contractions are sometimes difficult to tell apart from true labor because they can be very uncomfortable. You should not feel embarrassed if you go to the hospital with false labor. Sometimes, the only way to tell if you are in true labor is for your health care provider to look for changes in the cervix. The health care provider will do a physical exam and may monitor your contractions. If you are not in true labor, the exam should show that your cervix is not dilating and your water has not broken. If there are no other health problems associated with your  pregnancy, it is completely safe for you to be sent home with false labor. You may continue to have Braxton Hicks contractions until you go into true labor. How to tell the difference between true labor and false labor True labor Contractions last 30-70 seconds. Contractions become very regular. Discomfort is usually felt in the top of the uterus, and it spreads to the lower abdomen and low back. Contractions do not go away with walking. Contractions usually become more intense and increase in frequency. The cervix dilates and gets thinner. False labor Contractions are usually shorter and not as strong as true labor contractions. Contractions are usually irregular. Contractions are often felt in the front of the lower abdomen and in the groin. Contractions may go away when you walk around or change positions while lying down. Contractions get weaker and are shorter-lasting as time goes on. The cervix usually does not dilate or become thin. Follow these instructions at home:  Take over-the-counter and prescription medicines only as told by your health care provider. Keep up with your usual exercises and follow other instructions from your health care provider. Eat and drink lightly if you think  you are going into labor. If Braxton Hicks contractions are making you uncomfortable: Change your position from lying down or resting to walking, or change from walking to resting. Sit and rest in a tub of warm water. Drink enough fluid to keep your urine pale yellow. Dehydration may cause these contractions. Do slow and deep breathing several times an hour. Keep all follow-up prenatal visits as told by your health care provider. This is important. Contact a health care provider if: You have a fever. You have continuous pain in your abdomen. Get help right away if: Your contractions become stronger, more regular, and closer together. You have fluid leaking or gushing from your vagina. You pass  blood-tinged mucus (bloody show). You have bleeding from your vagina. You have low back pain that you never had before. You feel your baby's head pushing down and causing pelvic pressure. Your baby is not moving inside you as much as it used to. Summary Contractions that occur before labor are called Braxton Hicks contractions, false labor, or practice contractions. Braxton Hicks contractions are usually shorter, weaker, farther apart, and less regular than true labor contractions. True labor contractions usually become progressively stronger and regular, and they become more frequent. Manage discomfort from [facility] contractions by changing position, resting in a warm bath, drinking plenty of water, or practicing deep breathing. This information is not intended to replace advice given to you by your health care provider. Make sure you discuss any questions you have with your health care provider. Document Revised: 09/09/2017 Document Reviewed: 02/10/2017 Elsevier Patient Education  [place].

## 2022-05-10 NOTE — Progress Notes (Signed)
INITIAL OBSTETRICAL VISIT Patient name: Jasmine Pierce MRN [id number]  Date of birth: [birth year] Chief Complaint:   Initial Prenatal Visit  History of Present Illness:   Jasmine Pierce is a 21 y.o. [id number] African-American female at [redacted]w[redacted]d by LMP c/w u/s at 13 weeks with an Estimated Date of Delivery: 05/27/22 being seen today for her initial obstetrical visit with [place]. Moved back from [place] last week. Had regular care there, no problems.    Patient's last menstrual period was 08/20/2021. Her obstetrical history is significant for  SAB x 1, PCS at 36.5wk w/ twins, baby B w/ FGR and both w/ elevated dopplers . Wants TOLAC   Today she reports  regular uncomfortable contractions last few days and again this am, went to [facility] 7/29 and yesterday am, was 1.5/70/-2 and didn't change . Some decreased fm.   Last pap 11/20/21. Results were: NILM w/ HRHPV not done     05/10/2022    9:52 AM 03/31/2020   10:39 AM 07/27/2019   11:49 AM 05/03/2018   10:47 AM  Depression screen PHQ 2/9  Decreased Interest 0 0 1 0  Down, Depressed, Hopeless 0 1 0 0  PHQ - 2 Score 0 1 1 0  Altered sleeping 1 0 3   Tired, decreased energy 1 1 1    Change in appetite 0 1 1   Feeling bad or failure about yourself  0 0 0   Trouble concentrating 0 0 0   Moving slowly or fidgety/restless 0 0 0   Suicidal thoughts 0 0 0   PHQ-9 Score 2 3 6    Difficult doing work/chores  Somewhat difficult          05/10/2022    9:52 AM 03/31/2020   10:39 AM  GAD 7 : Generalized Anxiety Score  Nervous, Anxious, on Edge 1 0  Control/stop worrying 1 1  Worry too much - different things 0 1  Trouble relaxing 1 1  Restless 0 0  Easily annoyed or irritable 1 2  Afraid - awful might happen 1 2  Total GAD 7 Score 5 7  Anxiety Difficulty  Not difficult at all     Review of Systems:   Pertinent items are noted in HPI Denies cramping/contractions, leakage of fluid, vaginal bleeding, abnormal vaginal discharge w/ itching/odor/irritation, headaches,  visual changes, shortness of breath, chest pain, abdominal pain, severe nausea/vomiting, or problems with urination or bowel movements unless otherwise stated above.  Pertinent History Reviewed:  Reviewed past medical,surgical, social, obstetrical and family history.  Reviewed problem list, medications and allergies. OB History  Gravida Para Term Preterm AB Living  3 1 0 1 1 2   SAB IAB Ectopic Multiple Live Births  1 0 0 1 2    # Outcome Date GA Lbr Len/2nd Weight Sex Delivery Anes PTL Lv  3 Current           2A Preterm 01/09/20 [redacted]w[redacted]d  5 lb 9.8 oz (2.545 kg) M CS-LTranv Spinal  LIV  2B Preterm 01/09/20 [redacted]w[redacted]d  4 lb 11.3 oz (2.135 kg) M CS-LTranv Spinal  LIV  1 SAB            Physical Assessment:   Vitals:   05/10/22 0954  BP: 104/71  Pulse: 87  Weight: 146 lb (66.2 kg)  Body mass index is 25.86 kg/m.       Physical Examination:  General appearance - well appearing, and in no distress  Mental status - alert, oriented to person, place,  and time  Psych:  She has a normal mood and affect  Skin - warm and dry, normal color, no suspicious lesions noted  Chest - effort normal, all lung fields clear to auscultation bilaterally  Heart - normal rate and regular rhythm  Abdomen - soft, nontender  Extremities:  No swelling or varicosities noted  Pelvic - VULVA: normal appearing vulva with no masses, tenderness or lesions  VAGINA: normal appearing vagina with normal color and discharge, no lesions  CERVIX: normal appearing cervix without discharge or lesions, no CMT  Thin prep pap is not done   SVE: 4/80/-2 NST: FHR baseline 125 bpm, Variability: moderate, Accelerations:present, Decelerations:  Absent= Cat 1/reactive Toco: regular, every 2-4 minutes  Repeat SVE 1hr later 4/80/-2 w/ BBOW and bloody show, breathing w/ uc's Chaperone: Peggy Dones    No results found for this or any previous visit (from the past 24 hour(s)).  Assessment & Plan:  1) Low-Risk Pregnancy [id number] at [redacted]w[redacted]d with  an Estimated Date of Delivery: 05/27/22   2) Initial OB visit  3) Prev c/s for twins, B w/ FGR, both w/ elevated dopplers> wants TOLAC, sign consent at hospital  4) Early labor> regular uncomfortable uc's w/ BBOW, bloody show, and cervical change since yesterday am. To [facility] for direct admit, sign TOLAC consent on admit. Notified Dr. Crissie Reese & L&D team as well as L&D charge  5) Decreased fm> reactive NST  6) GBS+> on review of records  Meds: No orders of the defined types were placed in this encounter.   Genetic & carrier screening reviewed: normal Ultrasound-normal per pt, unable to find in records CCNC completed> form faxed if has or is planning to apply for medicaid The nature of [place] [facility] Center for [employer] with multiple MDs and other Advanced Practice Providers was explained to patient; also emphasized that fellows, residents, and students are part of our team.   Follow-up: Return for will schedule pp visit after delivery.   Orders Placed This Encounter  Procedures   OB RESULTS CONSOLE Rubella Antibody   OB RESULTS CONSOLE RPR   OB RESULTS CONSOLE Rubella Antibody   OB RESULTS CONSOLE Hepatitis B surface antigen   OB RESULTS CONSOLE GC/Chlamydia   OB RESULTS CONSOLE RPR   OB RESULTS CONSOLE Hemoglobin and hematocrit, blood   OB RESULTS CONSOLE PLATELET COUNT   Glucose Tolerance, 1 HR (50g)   OB RESULTS CONSOLE ABO/Rh   OB RESULTS CONSOLE Antibody Screen    Cheral Marker CNM, [facility] 05/10/2022  12:45 PM

## 2022-05-11 ENCOUNTER — Encounter: Payer: Self-pay | Admitting: Women's Health

## 2022-05-11 LAB — CERVICOVAGINAL ANCILLARY ONLY
Chlamydia: NEGATIVE
Comment: NEGATIVE
Comment: NORMAL
Neisseria Gonorrhea: NEGATIVE

## 2022-05-11 LAB — CBC
HCT: 30.4 % — ABNORMAL LOW (ref 36.0–46.0)
Hemoglobin: 10.4 g/dL — ABNORMAL LOW (ref 12.0–15.0)
MCH: 27.8 pg (ref 26.0–34.0)
MCHC: 34.2 g/dL (ref 30.0–36.0)
MCV: 81.3 fL (ref 80.0–100.0)
Platelets: 176 10*3/uL (ref 150–400)
RBC: 3.74 MIL/uL — ABNORMAL LOW (ref 3.87–5.11)
RDW: 13.3 % (ref 11.5–15.5)
WBC: 8.8 10*3/uL (ref 4.0–10.5)
nRBC: 0 % (ref 0.0–0.2)

## 2022-05-11 LAB — RPR: RPR Ser Ql: NONREACTIVE

## 2022-05-11 MED ORDER — ZOLPIDEM TARTRATE 5 MG PO TABS: 5.0000 mg | ORAL_TABLET | Freq: Every evening | ORAL | Status: DC | PRN

## 2022-05-11 MED ORDER — DIBUCAINE (PERIANAL) 1 % EX OINT: 1.0000 | TOPICAL_OINTMENT | CUTANEOUS | Status: DC | PRN

## 2022-05-11 MED ORDER — ACETAMINOPHEN 325 MG PO TABS: 650.0000 mg | ORAL_TABLET | ORAL | Status: DC | PRN

## 2022-05-11 MED ORDER — ONDANSETRON HCL 4 MG/2ML IJ SOLN: 4.0000 mg | INTRAMUSCULAR | Status: DC | PRN

## 2022-05-11 MED ORDER — TETANUS-DIPHTH-ACELL PERTUSSIS 5-2.5-18.5 LF-MCG/0.5 IM SUSY: 0.5000 mL | PREFILLED_SYRINGE | Freq: Once | INTRAMUSCULAR | Status: DC

## 2022-05-11 MED ORDER — PRENATAL MULTIVITAMIN CH
1.0000 | ORAL_TABLET | Freq: Every day | ORAL | Status: DC
Start: 2022-05-11 — End: 2022-05-12
  Administered 2022-05-11 – 2022-05-12 (×2): 1 via ORAL
  Filled 2022-05-11 (×2): qty 1

## 2022-05-11 MED ORDER — SENNOSIDES-DOCUSATE SODIUM 8.6-50 MG PO TABS
2.0000 | ORAL_TABLET | Freq: Every day | ORAL | Status: DC
Start: 2022-05-11 — End: 2022-05-12
  Administered 2022-05-11 – 2022-05-12 (×2): 2 via ORAL
  Filled 2022-05-11 (×2): qty 2

## 2022-05-11 MED ORDER — IBUPROFEN 600 MG PO TABS
600.0000 mg | ORAL_TABLET | Freq: Four times a day (QID) | ORAL | Status: DC
  Administered 2022-05-11 – 2022-05-12 (×7): 600 mg via ORAL
  Filled 2022-05-11 (×7): qty 1

## 2022-05-11 MED ORDER — BENZOCAINE-MENTHOL 20-0.5 % EX AERO
1.0000 | INHALATION_SPRAY | CUTANEOUS | Status: DC | PRN
  Administered 2022-05-11 (×2): 1 via TOPICAL
  Filled 2022-05-11 (×2): qty 56

## 2022-05-11 MED ORDER — WITCH HAZEL-GLYCERIN EX PADS
1.0000 | MEDICATED_PAD | CUTANEOUS | Status: DC | PRN
  Administered 2022-05-11: 1 via TOPICAL

## 2022-05-11 MED ORDER — COCONUT OIL OIL: 1.0000 | TOPICAL_OIL | Status: DC | PRN

## 2022-05-11 MED ORDER — DIPHENHYDRAMINE HCL 25 MG PO CAPS: 25.0000 mg | ORAL_CAPSULE | Freq: Four times a day (QID) | ORAL | Status: DC | PRN

## 2022-05-11 MED ORDER — SIMETHICONE 80 MG PO CHEW: 80.0000 mg | CHEWABLE_TABLET | ORAL | Status: DC | PRN

## 2022-05-11 MED ORDER — MEDROXYPROGESTERONE ACETATE 150 MG/ML IM SUSP: 150.0000 mg | Freq: Once | INTRAMUSCULAR | Status: DC

## 2022-05-11 MED ORDER — ONDANSETRON HCL 4 MG PO TABS: 4.0000 mg | ORAL_TABLET | ORAL | Status: DC | PRN

## 2022-05-11 NOTE — Progress Notes (Addendum)
POSTPARTUM PROGRESS NOTE  Subjective: Jasmine Pierce is a 21 y.o. [id number] s/p SVD at [redacted]w[redacted]d.  She reports she doing well. No acute events overnight. She denies any problems with ambulating, voiding or po intake. Denies nausea or vomiting. She has  passed flatus. Pain is well controlled.  Lochia is appropriate.  Objective: Blood pressure 107/67, pulse (!) 48, temperature 97.9 F (36.6 C), temperature source Oral, resp. rate 18, height 5\' 3"  (1.6 m), weight 66.2 kg, last menstrual period 08/20/2021, SpO2 100 %, unknown if currently breastfeeding.  Physical Exam:  General: alert, cooperative and no distress Chest: no respiratory distress Abdomen: soft, non-tender  Uterine Fundus: firm and at level of umbilicus Extremities: No calf swelling or tenderness  no edema  Recent Labs    05/10/22 1346 05/11/22 0411  HGB 12.6 10.4*  HCT 36.7 30.4*    Assessment/Plan: Jasmine Pierce is a 22 y.o. 30 s/p SVD at [redacted]w[redacted]d.  Routine Postpartum Care: Doing well, pain well-controlled.  -- Continue routine care, lactation support  -- Contraception: depo, order placed -- Feeding: breast, consult to lactation placed.   Dispo: Plan for discharge tomorrow after circumcision.  [redacted]w[redacted]d, DO Faculty Practice, Center for [facility] 05/11/2022 7:01 AM   Attestation of CNM Supervision of Resident: Evaluation and management procedures were performed by the [facility] Medicine Resident under my supervision. I was immediately available for direct supervision, assistance and direction throughout this encounter.  I also confirm that I have verified the information documented in the resident's note, and that I have also personally reperformed the pertinent components of the physical exam and all of the medical decision making activities.  I have also made any necessary editorial changes.  [facility], CNM 05/11/2022 7:09 AM

## 2022-05-11 NOTE — Lactation Note (Addendum)
This note was copied from a baby's chart. Lactation Consultation Note  Patient Name: Jasmine Pierce [id number] Date: 05/11/2022 Reason for consult: Initial assessment;Mother's request;Early term 37-38.6wks;Infant < 6lbs;Breastfeeding assistance Age:21 years  Birth parent asked if infant breathing okay. Infant nasal congestion. LC adjusted latch, placed him STS in cross cradle prone with signs of milk transfer. No signs of distress noted. LC discussed with RN, Marissa Mabe.   Plan. 1 To feed based on cues 8-12x 24hr period.  2. BP to offer breasts and look for signs of milk transfer. BP to follow LPTI guidelines given birthweight less than 2500 grams. Supplementing volumes listed on board calculated based on birthweight. Pace bottle feeding with slow flow nipple of EBM first followed by formula 3. Post pump after each feeding for 15 mins. Mom using her Motif pump with 24 flanges.  We do not have 21 flanges at this time. BP aware with pumping flange size can change to adjust accordingly.   All questions answered at the end of the visit.   BP like to try pumping to offer for supplementation, if not enough volume use formula.   Maternal Data Has patient been taught Hand Expression?: Yes Does the patient have breastfeeding experience prior to this delivery?: Yes How long did the patient breastfeed?: twins for 2 months  Feeding Mother's Current Feeding Choice: Breast Milk  LATCH Score Latch: Repeated attempts needed to sustain latch, nipple held in mouth throughout feeding, stimulation needed to elicit sucking reflex.  Audible Swallowing: Spontaneous and intermittent  Type of Nipple: Everted at rest and after stimulation  Comfort (Breast/Nipple): Soft / non-tender  Hold (Positioning): Assistance needed to correctly position infant at breast and maintain latch.  LATCH Score: 8   Lactation Tools Discussed/Used Tools: Pump;Flanges Flange Size: 24 Breast pump type: Other (comment)  (personal pump) Pump Education: Setup, frequency, and cleaning;Milk Storage Reason for Pumping: increase stimulation Pumping frequency: post pump after feeding for 15 mins  Interventions Interventions: Breast feeding basics reviewed;Assisted with latch;Skin to skin;Breast massage;Hand express;Breast compression;Adjust position;Support pillows;Position options;Expressed milk;DEBP;Education;Pace feeding;LC Services brochure;LPT handout/interventions  Discharge Pump: Personal  Consult Status Consult Status: Follow-up Date: 05/12/22 Follow-up type: In-patient    Jasmine Pierce  Jasmine Pierce 05/11/2022, 12:37 PM

## 2022-05-12 MED ORDER — IBUPROFEN 600 MG PO TABS: 600.0000 mg | ORAL_TABLET | Freq: Four times a day (QID) | ORAL | 0 refills | Status: DC | PRN

## 2022-05-12 NOTE — Lactation Note (Signed)
This note was copied from a baby's chart. Lactation Consultation Note  Patient Name: Jasmine Pierce [id number] Date: 05/12/2022 Reason for consult: Follow-up assessment;Mother's request;Early term 37-38.6wks;Infant < 6lbs;Breastfeeding assistance Age:23 hours Infant latched with signs of milk transfer.  BP supplementing with EBM after feeding 20 ml or more.  Post pump after each feeding for 15 mins with personal pump.  BP getting 50 ml per pumping session, now able to use her own milk as a supplement.   All questions answered at the end of the visit.  Infant adequate urine and stool output.  No signs of nipple trauma.   Maternal Data Has patient been taught Hand Expression?: Yes  Feeding Mother's Current Feeding Choice: Breast Milk and Formula  LATCH Score Latch: Repeated attempts needed to sustain latch, nipple held in mouth throughout feeding, stimulation needed to elicit sucking reflex.  Audible Swallowing: Spontaneous and intermittent  Type of Nipple: Everted at rest and after stimulation  Comfort (Breast/Nipple): Soft / non-tender  Hold (Positioning): Assistance needed to correctly position infant at breast and maintain latch.  LATCH Score: 8   Lactation Tools Discussed/Used Tools: Pump;Flanges Flange Size: 24 Breast pump type: Double-Electric Breast Pump Pump Education: Setup, frequency, and cleaning;Milk Storage Reason for Pumping: increase stimulation Pumping frequency: post pump after feeding for 15 mins on maintenance  Interventions Interventions: Breast feeding basics reviewed;Assisted with latch;Skin to skin;Breast massage;Hand express;Breast compression;Adjust position;Support pillows;Position options;Expressed milk;DEBP;Education;Pace feeding;LC Services brochure;LPT handout/interventions;Infant Driven Feeding Algorithm education  Discharge Discharge Education: Engorgement and breast care;Warning signs for feeding baby;Outpatient recommendation;Outpatient  Epic message sent Pump: DEBP;Personal  Consult Status Consult Status: Complete Date: 05/12/22    Odus Clasby  Nicholson-Springer 05/12/2022, 3:13 PM

## 2022-05-12 NOTE — Anesthesia Postprocedure Evaluation (Signed)
Anesthesia Post Note  Patient: Jasmine Pierce  Procedure(s) Performed: AN AD HOC LABOR EPIDURAL     Patient location during evaluation: Mother Baby Anesthesia Type: Epidural Level of consciousness: awake Pain management: pain level controlled Vital Signs Assessment: post-procedure vital signs reviewed and stable Respiratory status: spontaneous breathing Cardiovascular status: stable Postop Assessment: no headache, no backache, epidural receding, patient able to bend at knees and no apparent nausea or vomiting Anesthetic complications: no   No notable events documented.  Last Vitals:  Vitals:   05/11/22 2130 05/12/22 0531  BP: (!) 88/59 106/71  Pulse: (!) 57 (!) 54  Resp: 18 18  Temp:  37 C  SpO2: 100% 100%    Last Pain:  Vitals:   05/12/22 0531  TempSrc: Oral  PainSc:                  Caren Macadam

## 2022-05-12 NOTE — Progress Notes (Signed)
Patient states she will get depo shot in office

## 2022-05-12 NOTE — Discharge Instructions (Signed)
NO SEX UNTIL AFTER YOU GET YOUR BIRTH CONTROL  

## 2022-05-14 ENCOUNTER — Encounter: Payer: Self-pay | Admitting: Women's Health

## 2022-05-19 ENCOUNTER — Telehealth (HOSPITAL_COMMUNITY): Payer: Self-pay | Admitting: *Deleted

## 2022-05-19 NOTE — Telephone Encounter (Signed)
Left phone voicemail message.  Duffy Rhody, RN 05-19-2022 at 9:37am

## 2022-05-26 ENCOUNTER — Encounter: Payer: Self-pay | Admitting: Women's Health

## 2022-06-23 ENCOUNTER — Ambulatory Visit: Payer: Self-pay | Admitting: Advanced Practice Midwife

## 2022-06-24 ENCOUNTER — Ambulatory Visit: Payer: Medicaid Other | Admitting: Advanced Practice Midwife

## 2022-09-18 ENCOUNTER — Encounter: Payer: Self-pay | Admitting: Women's Health

## 2022-09-21 ENCOUNTER — Encounter: Payer: Self-pay | Admitting: Adult Health

## 2022-09-21 ENCOUNTER — Ambulatory Visit: Payer: Medicaid Other | Admitting: Adult Health

## 2022-09-21 VITALS — BP 115/74 | HR 60 | Ht 63.0 in | Wt 128.0 lb

## 2022-09-21 DIAGNOSIS — Z30013 Encounter for initial prescription of injectable contraceptive: Secondary | ICD-10-CM | POA: Diagnosis not present

## 2022-09-21 DIAGNOSIS — Z3202 Encounter for pregnancy test, result negative: Secondary | ICD-10-CM | POA: Insufficient documentation

## 2022-09-21 LAB — POCT URINE PREGNANCY: Preg Test, Ur: NEGATIVE

## 2022-09-21 MED ORDER — MEDROXYPROGESTERONE ACETATE 150 MG/ML IM SUSP: 150.0000 mg | INTRAMUSCULAR | 4 refills | Status: DC

## 2022-09-21 MED ORDER — MEDROXYPROGESTERONE ACETATE 150 MG/ML IM SUSP
150.0000 mg | Freq: Once | INTRAMUSCULAR | Status: AC
  Administered 2022-09-21: 150 mg via INTRAMUSCULAR

## 2022-09-21 NOTE — Progress Notes (Signed)
  Subjective:     Patient ID: Jasmine Pierce, female   DOB: Jul 26, 2001, 21 y.o.   MRN: [id number]  HPI Jasmine Pierce is a 21 year old female,married, [id number], in to discuss getting on depo, has used in the past.  Last pap was NILM 03/31/20.  Review of Systems Patient denies any headaches, hearing loss, fatigue, blurred vision, shortness of breath, chest pain, abdominal pain, problems with bowel movements, urination, or intercourse. No joint pain or mood swings.    Reviewed past medical,surgical, social and family history. Reviewed medications and allergies.  Objective:   Physical Exam BP 115/74 (BP Location: Left Arm, Patient Position: Sitting, Cuff Size: Normal)   Pulse 60   Ht 5\' 3"  (1.6 m)   Wt 128 lb (58.1 kg)   BMI 22.67 kg/m  UPT is negative Skin warm and dry.  Lungs: clear to ausculation bilaterally. Cardiovascular: regular rate and rhythm.    Fall risk is low  Upstream - 09/21/22 1611       Pregnancy Intention Screening   Does the patient want to become pregnant in the next year? No    Does the patient's partner want to become pregnant in the next year? No    Would the patient like to discuss contraceptive options today? No      Contraception Wrap Up   Current Method Female Condom    End Method Hormonal Injection    Contraception Counseling Provided No             Assessment:     1. Negative pregnancy test  2. Encounter for initial prescription of injectable contraceptive Will give depo in office today and rx has been sent for depo provera 150 mg IM  Use condoms for 2 weeks Meds ordered this encounter  Medications   medroxyPROGESTERone (DEPO-PROVERA) 150 MG/ML injection    Sig: Inject 1 mL (150 mg total) into the muscle every 3 (three) months.    Dispense:  1 mL    Refill:  4    Order Specific Question:   Supervising Provider    Answer:   14/12/23 H [[id number]]   medroxyPROGESTERone (DEPO-PROVERA) injection 150 mg       Plan:     Return in 12 weeks for depo

## 2022-09-28 ENCOUNTER — Ambulatory Visit: Payer: Medicaid Other | Admitting: Adult Health

## 2022-12-07 ENCOUNTER — Ambulatory Visit: Payer: Medicaid Other | Admitting: Adult Health

## 2022-12-14 ENCOUNTER — Ambulatory Visit (INDEPENDENT_AMBULATORY_CARE_PROVIDER_SITE_OTHER): Payer: Medicaid Other | Admitting: *Deleted

## 2022-12-14 DIAGNOSIS — Z3042 Encounter for surveillance of injectable contraceptive: Secondary | ICD-10-CM | POA: Diagnosis not present

## 2022-12-14 MED ORDER — MEDROXYPROGESTERONE ACETATE 150 MG/ML IM SUSY
150.0000 mg | PREFILLED_SYRINGE | Freq: Once | INTRAMUSCULAR | Status: AC
  Administered 2022-12-14: 150 mg via INTRAMUSCULAR

## 2022-12-14 NOTE — Progress Notes (Signed)
   NURSE VISIT- INJECTION  SUBJECTIVE:  Jasmine Pierce is a 22 y.o. [phone] female here for a Depo Provera for contraception/period management. She is a GYN patient.   OBJECTIVE:  There were no vitals taken for this visit.  Appears well, in no apparent distress  Injection administered in: Right deltoid  Meds ordered this encounter  Medications   medroxyPROGESTERone Acetate SUSY 150 mg    ASSESSMENT: GYN patient Depo Provera for contraception/period management PLAN: Follow-up: in 11-13 weeks for next Depo   Levy Pupa  12/14/2022 11:37 AM

## 2023-01-26 ENCOUNTER — Encounter: Payer: Self-pay | Admitting: Adult Health

## 2023-01-26 ENCOUNTER — Ambulatory Visit (INDEPENDENT_AMBULATORY_CARE_PROVIDER_SITE_OTHER): Payer: Medicaid Other | Admitting: Adult Health

## 2023-01-26 ENCOUNTER — Other Ambulatory Visit (HOSPITAL_COMMUNITY)
Admission: RE | Admit: 2023-01-26 | Discharge: 2023-01-26 | Disposition: A | Discharge status: Home / Self Care | Payer: Medicaid Other | Source: Ambulatory Visit | Attending: Adult Health | Admitting: Adult Health

## 2023-01-26 VITALS — BP 120/78 | HR 64 | Ht 63.0 in | Wt 122.0 lb

## 2023-01-26 DIAGNOSIS — R5383 Other fatigue: Secondary | ICD-10-CM | POA: Diagnosis not present

## 2023-01-26 DIAGNOSIS — Z3202 Encounter for pregnancy test, result negative: Secondary | ICD-10-CM | POA: Diagnosis not present

## 2023-01-26 DIAGNOSIS — F419 Anxiety disorder, unspecified: Secondary | ICD-10-CM | POA: Diagnosis not present

## 2023-01-26 DIAGNOSIS — Z0000 Encounter for general adult medical examination without abnormal findings: Secondary | ICD-10-CM | POA: Insufficient documentation

## 2023-01-26 DIAGNOSIS — R42 Dizziness and giddiness: Secondary | ICD-10-CM | POA: Insufficient documentation

## 2023-01-26 DIAGNOSIS — Z3042 Encounter for surveillance of injectable contraceptive: Secondary | ICD-10-CM | POA: Diagnosis not present

## 2023-01-26 DIAGNOSIS — K649 Unspecified hemorrhoids: Secondary | ICD-10-CM | POA: Diagnosis not present

## 2023-01-26 DIAGNOSIS — N939 Abnormal uterine and vaginal bleeding, unspecified: Secondary | ICD-10-CM | POA: Diagnosis not present

## 2023-01-26 DIAGNOSIS — F32A Depression, unspecified: Secondary | ICD-10-CM | POA: Diagnosis not present

## 2023-01-26 DIAGNOSIS — R002 Palpitations: Secondary | ICD-10-CM

## 2023-01-26 LAB — POCT URINE PREGNANCY: Preg Test, Ur: NEGATIVE

## 2023-01-26 NOTE — Progress Notes (Signed)
Patient ID: Jasmine Pierce, female   DOB: 2001/02/26, 22 y.o.   MRN: [id number] History of Present Illness: Jasmine Pierce is a 22 year old AA female, married, [id number] in for a well woman gyn exam and pap. She had AUB on depo and passed clot about 2-3 weeks ago. She is tired, had dizziness an heart palpitations and low energy.  She says has hemorrhoids, since having babies.   Current Medications, Allergies, Past Medical History, Past Surgical History, Family History and Social History were reviewed in [employer] record.     Review of Systems: Patient denies any headaches, hearing loss, blurred vision, shortness of breath, chest pain, abdominal pain, problems with bowel movements, urination, or intercourse. No joint pain or mood swings.  See HPI for positives.   Physical Exam:BP 120/78 (BP Location: Left Arm, Patient Position: Sitting, Cuff Size: Normal)   Pulse 64   Ht  (1.6 m)   Wt 122 lb (55.3 kg)   Breastfeeding No   BMI 21.61 kg/m  UPT is negative  General:  Well developed, well nourished, no acute distress Skin:  Warm and dry Neck:  Midline trachea, normal thyroid, good ROM, no lymphadenopathy Lungs; Clear to auscultation bilaterally Breast:  No dominant palpable mass, retraction, or nipple discharge Cardiovascular: Regular rate and rhythm Abdomen:  Soft, non tender, no hepatosplenomegaly Pelvic:  External genitalia is normal in appearance, no lesions.  The vagina is normal in appearance. Urethra has no lesions or masses. The cervix is bulbous, everted at os,pap with GC/CHL performed.  Uterus is felt to be normal size, shape, and contour.  No adnexal masses or tenderness noted.Bladder is non tender, no masses felt. Has small external hemorrhoid. Extremities/musculoskeletal:  No swelling or varicosities noted, no clubbing or cyanosis Psych:  No mood changes, alert and cooperative,seems happy AA is 0 Fall risk is low    01/26/2023    3:53 PM 05/10/2022    9:52 AM  03/31/2020   10:39 AM  Depression screen PHQ 2/9  Decreased Interest 1 0 0  Down, Depressed, Hopeless 1 0 1  PHQ - 2 Score 2 0 1  Altered sleeping 1 1 0  Tired, decreased energy Change in appetite 3 0 1  Feeling bad or failure about yourself  1 0 0  Trouble concentrating 2 0 0  Moving slowly or fidgety/restless 1 0 0  Suicidal thoughts 0 0 0  PHQ-9 Score Difficult doing work/chores   Somewhat difficult   She declines meds.    01/26/2023    3:53 PM 05/10/2022    9:52 AM 03/31/2020   10:39 AM  GAD 7 : Generalized Anxiety Score  Nervous, Anxious, on Edge 1 1 0  Control/stop worrying Worry too much - different things 2 0 1  Trouble relaxing Restless 1 0 0  Easily annoyed or irritable Afraid - awful might happen Total GAD 7 Score Anxiety Difficulty   Not difficult at all    Upstream - 01/26/23 1558       Pregnancy Intention Screening   Does the patient want to become pregnant in the next year? No    Does the patient's partner want to become pregnant in the next year? No    Would the patient like to discuss contraceptive options today? No      Contraception Wrap Up  Current Method Hormonal Injection    End Method Hormonal Injection              Examination chaperoned by Gladys Damme NP student and she assisted with exam.   Impression and Plan: 1. Routine general medical examination at a health care facility Pap sent Pap in 3 years if normal Physical in 1 year  - Cytology - PAP( [facility])  2. Anxiety and depression She declines meds Denies any SI or HI  3. Encounter for surveillance of injectable contraceptive Next depo due 03/08/23.  4. Abnormal uterine bleeding (AUB) Had 1 episode, if returns call  - CBC  5. Tired Will check labs to rule anemia or thyroid issues  - CBC - Comprehensive metabolic panel - TSH + free T4  6. Dizzy spells Check labs  - CBC - Comprehensive metabolic panel  7. Heart  palpitations Will check labs - CBC - Comprehensive metabolic panel - TSH + free T4  8. Hemorrhoids, unspecified hemorrhoid type Try preparation H or anusol Can use Tucks wipes  9. Pregnancy examination or test, negative result - POCT urine pregnancy

## 2023-01-27 LAB — COMPREHENSIVE METABOLIC PANEL WITH GFR
ALT: 17 [IU]/L (ref 0–32)
AST: 24 [IU]/L (ref 0–40)
Albumin/Globulin Ratio: 1.7 (ref 1.2–2.2)
Albumin: 5 g/dL (ref 4.0–5.0)
Alkaline Phosphatase: 58 [IU]/L (ref 44–121)
BUN/Creatinine Ratio: 11 (ref 9–23)
BUN: 8 mg/dL (ref 6–20)
Bilirubin Total: 0.3 mg/dL (ref 0.0–1.2)
CO2: 19 mmol/L — ABNORMAL LOW (ref 20–29)
Calcium: 9.6 mg/dL (ref 8.7–10.2)
Chloride: 102 mmol/L (ref 96–106)
Creatinine, Ser: 0.73 mg/dL (ref 0.57–1.00)
Globulin, Total: 2.9 g/dL (ref 1.5–4.5)
Glucose: 96 mg/dL (ref 70–99)
Potassium: 4.1 mmol/L (ref 3.5–5.2)
Sodium: 139 mmol/L (ref 134–144)
Total Protein: 7.9 g/dL (ref 6.0–8.5)
eGFR: 118 mL/min/{1.73_m2}

## 2023-01-27 LAB — CBC
Hematocrit: 45.4 % (ref 34.0–46.6)
Hemoglobin: 14.7 g/dL (ref 11.1–15.9)
MCH: 27.8 pg (ref 26.6–33.0)
MCHC: 32.4 g/dL (ref 31.5–35.7)
MCV: 86 fL (ref 79–97)
Platelets: 266 10*3/uL (ref 150–450)
RBC: 5.29 x10E6/uL — ABNORMAL HIGH (ref 3.77–5.28)
RDW: 13 % (ref 11.7–15.4)
WBC: 8.2 10*3/uL (ref 3.4–10.8)

## 2023-01-27 LAB — TSH+FREE T4
Free T4: 1.17 ng/dL (ref 0.82–1.77)
TSH: 0.905 u[IU]/mL (ref 0.450–4.500)

## 2023-01-28 LAB — CYTOLOGY - PAP
Chlamydia: NEGATIVE
Comment: NEGATIVE
Comment: NORMAL
Diagnosis: NEGATIVE
Neisseria Gonorrhea: NEGATIVE

## 2023-02-02 ENCOUNTER — Other Ambulatory Visit: Payer: Self-pay | Admitting: Adult Health

## 2023-02-02 MED ORDER — ESCITALOPRAM OXALATE 10 MG PO TABS: 10.0000 mg | ORAL_TABLET | Freq: Every day | ORAL | 2 refills | Status: DC

## 2023-02-02 NOTE — Progress Notes (Signed)
Will rx lexapro and see in about 8 weeks for ROS

## 2023-02-10 ENCOUNTER — Other Ambulatory Visit: Payer: Self-pay | Admitting: Adult Health

## 2023-02-10 MED ORDER — MEGESTROL ACETATE 40 MG PO TABS: ORAL_TABLET | ORAL | 0 refills | Status: DC

## 2023-03-08 ENCOUNTER — Ambulatory Visit: Payer: Medicaid Other

## 2023-03-23 ENCOUNTER — Other Ambulatory Visit: Payer: Self-pay | Admitting: Adult Health

## 2023-03-23 MED ORDER — LO LOESTRIN FE 1 MG-10 MCG / 10 MCG PO TABS: 1.0000 | ORAL_TABLET | Freq: Every day | ORAL | 11 refills | Status: DC

## 2023-03-23 NOTE — Progress Notes (Signed)
Will rx lo loestrin  

## 2023-03-23 NOTE — Progress Notes (Signed)
Rx lo Loestrin to [facility]

## 2023-06-07 ENCOUNTER — Encounter: Payer: Self-pay | Admitting: Adult Health

## 2023-06-07 ENCOUNTER — Ambulatory Visit: Payer: Medicaid Other | Admitting: Adult Health

## 2023-06-07 VITALS — BP 114/73 | HR 65 | Ht 63.0 in | Wt 123.0 lb

## 2023-06-07 DIAGNOSIS — N921 Excessive and frequent menstruation with irregular cycle: Secondary | ICD-10-CM | POA: Insufficient documentation

## 2023-06-07 DIAGNOSIS — Z3202 Encounter for pregnancy test, result negative: Secondary | ICD-10-CM | POA: Diagnosis not present

## 2023-06-07 DIAGNOSIS — R1031 Right lower quadrant pain: Secondary | ICD-10-CM

## 2023-06-07 LAB — POCT URINE PREGNANCY: Preg Test, Ur: NEGATIVE

## 2023-06-07 NOTE — Progress Notes (Signed)
  Subjective:     Patient ID: Jasmine Pierce, female   DOB: 2001/08/23, 22 y.o.   MRN: [id number]  HPI Chole is a 22 year old female,married, [id number], in complaining of having heavy and long period this last cycle, started 7/30, and ended 8/25, changed pads every 4-5 hours. Had been on depo then COC, but stopped COC felt shaky and had fever when took just 1 pill.      Component Value Date/Time   DIAGPAP  01/26/2023 1552    - Negative for intraepithelial lesion or malignancy (NILM)   DIAGPAP  03/31/2020 1040    - Negative for intraepithelial lesion or malignancy (NILM)   ADEQPAP  01/26/2023 1552    Satisfactory for evaluation; transformation zone component PRESENT.   ADEQPAP  03/31/2020 1040    Satisfactory for evaluation; transformation zone component PRESENT.     Review of Systems +heavy long period Mild cramps RLQ discomfort Reviewed past medical,surgical, social and family history. Reviewed medications and allergies.     Objective:   Physical Exam BP 114/73 (BP Location: Left Arm, Patient Position: Sitting, Cuff Size: Normal)   Pulse 65   Ht 5\' 3"  (1.6 m)   Wt 123 lb (55.8 kg)   LMP 05/10/2023 Comment: period ended 8/25  Breastfeeding No   BMI 21.79 kg/m  UPT is negative. Skin warm and dry.Pelvic: external genitalia is normal in appearance no lesions, vagina:pink discharge with odor,urethra has no lesions or masses noted, cervix:smooth,no CMT, uterus: normal size, shape and contour,mildly tender, no masses felt, adnexa: no masses, RLQ tenderness noted. Bladder is non tender and no masses felt. Tender over C section scar Fall risk is low  Upstream - 06/07/23 1145       Pregnancy Intention Screening   Does the patient want to become pregnant in the next year? No    Does the patient's partner want to become pregnant in the next year? No    Would the patient like to discuss contraceptive options today? No      Contraception Wrap Up   Current Method Female Condom    End Method  Female Condom               Examination chaperoned by Malachy Mood LPN Assessment:     1. Pregnancy examination or test, negative result - POCT urine pregnancy  2. Menorrhagia with irregular cycle Bleeding from 7/30 to 8/25 Pelvic US scheduled for 06/15/23 at [facility] at 12:30 pm to assess uterus and ovaries  - US PELVIC COMPLETE WITH TRANSVAGINAL; Future  3. RLQ abdominal pain Has had RLQ discomfort Will get [place] to assess  - US PELVIC COMPLETE WITH TRANSVAGINAL; Future     Plan:     Follow up in 2 weeks

## 2023-06-15 ENCOUNTER — Ambulatory Visit (HOSPITAL_COMMUNITY)
Admission: RE | Admit: 2023-06-15 | Discharge: 2023-06-15 | Disposition: A | Discharge status: Home / Self Care | Payer: Medicaid Other | Source: Ambulatory Visit | Attending: Adult Health | Admitting: Adult Health

## 2023-06-15 DIAGNOSIS — R1031 Right lower quadrant pain: Secondary | ICD-10-CM | POA: Diagnosis not present

## 2023-06-15 DIAGNOSIS — N83291 Other ovarian cyst, right side: Secondary | ICD-10-CM | POA: Diagnosis not present

## 2023-06-15 DIAGNOSIS — N939 Abnormal uterine and vaginal bleeding, unspecified: Secondary | ICD-10-CM | POA: Diagnosis not present

## 2023-06-15 DIAGNOSIS — N921 Excessive and frequent menstruation with irregular cycle: Secondary | ICD-10-CM | POA: Diagnosis not present

## 2023-06-21 ENCOUNTER — Ambulatory Visit: Payer: Medicaid Other | Admitting: Adult Health

## 2023-06-21 ENCOUNTER — Encounter: Payer: Self-pay | Admitting: Adult Health

## 2023-06-21 VITALS — BP 121/73 | HR 73 | Ht 63.0 in | Wt 125.0 lb

## 2023-06-21 DIAGNOSIS — Z3202 Encounter for pregnancy test, result negative: Secondary | ICD-10-CM

## 2023-06-21 DIAGNOSIS — N921 Excessive and frequent menstruation with irregular cycle: Secondary | ICD-10-CM | POA: Diagnosis not present

## 2023-06-21 LAB — POCT URINE PREGNANCY: Preg Test, Ur: NEGATIVE

## 2023-06-21 NOTE — Progress Notes (Signed)
  Subjective:     Patient ID: Jasmine Pierce, female   DOB: [birth year], 22 y.o.   MRN: [id number]  HPI Jasmine Pierce is a black female,married, [id number] back in follow up in bleeding and has had none, feels bloated. [place] 06/15/23 thickened endometrium 15.2 mm and simple cyst right ovary.      Component Value Date/Time   DIAGPAP  01/26/2023 1552    - Negative for intraepithelial lesion or malignancy (NILM)   DIAGPAP  03/31/2020 1040    - Negative for intraepithelial lesion or malignancy (NILM)   ADEQPAP  01/26/2023 1552    Satisfactory for evaluation; transformation zone component PRESENT.   ADEQPAP  03/31/2020 1040    Satisfactory for evaluation; transformation zone component PRESENT.    Review of Systems Feels bloated No bleeding  Reviewed past medical,surgical, social and family history. Reviewed medications and allergies.     Objective:   Physical Exam BP 121/73 (BP Location: Left Arm, Patient Position: Sitting, Cuff Size: Normal)   Pulse 73   Ht 5\' 3"  (1.6 m)   Wt 125 lb (56.7 kg)   LMP 05/10/2023 Comment: period ended 8/25  Breastfeeding No   BMI 22.14 kg/m  UPT is negative    Skin warm and dry. Lungs: clear to ausculation bilaterally. Cardiovascular: regular rate and rhythm.   Upstream - 06/21/23 1601       Pregnancy Intention Screening   Does the patient want to become pregnant in the next year? No    Does the patient's partner want to become pregnant in the next year? No    Would the patient like to discuss contraceptive options today? No      Contraception Wrap Up   Current Method Female Condom    End Method Female Condom             Assessment:     1. Menorrhagia with irregular cycle No bleeding    Let me know if next period heavy or not  Will get sonohystogram if bleeding heavy and long again  Plan:     Follow up prn

## 2023-06-30 DIAGNOSIS — Z23 Encounter for immunization: Secondary | ICD-10-CM | POA: Diagnosis not present

## 2023-07-06 DIAGNOSIS — Z1329 Encounter for screening for other suspected endocrine disorder: Secondary | ICD-10-CM | POA: Diagnosis not present

## 2023-07-06 DIAGNOSIS — Z020 Encounter for examination for admission to educational institution: Secondary | ICD-10-CM | POA: Diagnosis not present

## 2023-07-06 DIAGNOSIS — Z0001 Encounter for general adult medical examination with abnormal findings: Secondary | ICD-10-CM | POA: Diagnosis not present

## 2023-07-06 DIAGNOSIS — H527 Unspecified disorder of refraction: Secondary | ICD-10-CM | POA: Diagnosis not present

## 2023-07-25 ENCOUNTER — Other Ambulatory Visit: Payer: Self-pay | Admitting: Adult Health

## 2023-07-25 DIAGNOSIS — Z319 Encounter for procreative management, unspecified: Secondary | ICD-10-CM

## 2023-08-10 ENCOUNTER — Ambulatory Visit: Payer: Medicaid Other | Admitting: Adult Health

## 2023-08-19 ENCOUNTER — Other Ambulatory Visit: Payer: Self-pay | Admitting: Adult Health

## 2023-08-19 DIAGNOSIS — Z319 Encounter for procreative management, unspecified: Secondary | ICD-10-CM

## 2023-09-02 DIAGNOSIS — Z319 Encounter for procreative management, unspecified: Secondary | ICD-10-CM | POA: Diagnosis not present

## 2023-09-03 LAB — PROGESTERONE: Progesterone: 16.3 ng/mL

## 2023-09-07 ENCOUNTER — Encounter: Payer: Self-pay | Admitting: *Deleted

## 2023-09-07 ENCOUNTER — Ambulatory Visit: Payer: Medicaid Other | Admitting: *Deleted

## 2023-09-07 VITALS — BP 124/74 | HR 68 | Ht 63.0 in | Wt 125.6 lb

## 2023-09-07 DIAGNOSIS — Z3201 Encounter for pregnancy test, result positive: Secondary | ICD-10-CM | POA: Diagnosis not present

## 2023-09-07 LAB — POCT URINE PREGNANCY: Preg Test, Ur: POSITIVE — AB

## 2023-09-07 NOTE — Progress Notes (Signed)
   NURSE VISIT- PREGNANCY CONFIRMATION   SUBJECTIVE:  Jasmine Pierce is a 22 y.o. [phone] female at [redacted]w[redacted]d by certain LMP of Patient's last menstrual period was 08/13/2023. Here for pregnancy confirmation.  Home pregnancy test: positive x 4.   She reports  nausea & cramping. .  She is taking prenatal vitamins.    OBJECTIVE:  BP 124/74 (BP Location: Right Arm, Patient Position: Sitting, Cuff Size: Normal)   Pulse 68   Ht 5\' 3"  (1.6 m)   Wt 125 lb 9.6 oz (57 kg)   LMP 08/13/2023   Breastfeeding No   BMI 22.25 kg/m   Appears well, in no apparent distress  Results for orders placed or performed in visit on 09/07/23 (from the past 24 hour(s))  POCT urine pregnancy   Collection Time: 09/07/23 11:33 AM  Result Value Ref Range   Preg Test, Ur Positive (A) Negative    ASSESSMENT: Positive pregnancy test, [redacted]w[redacted]d by LMP    PLAN: Schedule for dating ultrasound in 5 weeks Prenatal vitamins: continue   Nausea medicines: not currently needed   OB packet given: Yes  Malachy Mood  09/07/2023 11:38 AM

## 2023-09-30 DIAGNOSIS — H5213 Myopia, bilateral: Secondary | ICD-10-CM | POA: Diagnosis not present

## 2023-10-10 ENCOUNTER — Other Ambulatory Visit: Payer: Self-pay | Admitting: Obstetrics & Gynecology

## 2023-10-10 DIAGNOSIS — O3680X0 Pregnancy with inconclusive fetal viability, not applicable or unspecified: Secondary | ICD-10-CM

## 2023-10-11 ENCOUNTER — Ambulatory Visit (INDEPENDENT_AMBULATORY_CARE_PROVIDER_SITE_OTHER): Payer: Medicaid Other

## 2023-10-11 DIAGNOSIS — Z3A08 8 weeks gestation of pregnancy: Secondary | ICD-10-CM | POA: Diagnosis not present

## 2023-10-11 DIAGNOSIS — Z3491 Encounter for supervision of normal pregnancy, unspecified, first trimester: Secondary | ICD-10-CM | POA: Diagnosis not present

## 2023-10-11 DIAGNOSIS — O3680X0 Pregnancy with inconclusive fetal viability, not applicable or unspecified: Secondary | ICD-10-CM

## 2023-10-11 NOTE — Progress Notes (Signed)
[place] 8+3 wks,single IUP with yolk sac,FHR 173 bpm,normal ovaries,CRL 21.30 mm

## 2023-10-12 NOTE — L&D Delivery Note (Addendum)
 OB/GYN Faculty Practice Delivery Note  Jasmine Pierce is a 23 y.o. [phone] s/p SVD at [redacted]w[redacted]d. She was admitted for IOL for IUGR 6% w/ elevated UAD (desired TOLAC).   ROM: 5h 31m with clear fluid with bloody show GBS Status:  POSITIVE/-- (07/20 0012) Maximum Maternal Temperature: 36.6  Labor Progress: Initial SVE: 1.5/40/-3. She was augmented with Foley balloon and AROM. She then progressed to complete.   Delivery Date/Time: 04/29/24 1402  Delivery: Called to room and patient was complete and pushing. Head delivered OA with compound hand. No nuchal cord present. Shoulder and body delivered in usual fashion. Infant with spontaneous cry, placed on mother's abdomen, dried and stimulated. Cord clamped x 2 after 1-minute delay, and cut by FOB. Cord blood drawn. Placenta delivered spontaneously with gentle cord traction. Fundus firm with massage and Pitocin . Labia, perineum, vagina, and cervix inspected with no lacerations identified.  Baby Weight: pending  Placenta: 3 vessel, intact. Sent to L&D Complications: None Lacerations: none EBL: 75 mL Analgesia: Epidural   Infant:  APGAR (1 MIN): 9  APGAR (5 MINS): 9   Hobert JAYSON Kasal, MD Family Medicine PGY-3 Center for [facility], [facility] 04/29/2024, 2:41 PM  Attestation of Supervision of Student: I was present, gloved, and participated in the entirety of delivery and third stage.  Almarie CHRISTELLA Moats, MD OB Fellow 04/29/2024 2:50 PM

## 2023-11-04 ENCOUNTER — Other Ambulatory Visit: Payer: Self-pay | Admitting: Obstetrics & Gynecology

## 2023-11-04 DIAGNOSIS — Z3682 Encounter for antenatal screening for nuchal translucency: Secondary | ICD-10-CM

## 2023-11-07 ENCOUNTER — Ambulatory Visit (INDEPENDENT_AMBULATORY_CARE_PROVIDER_SITE_OTHER): Payer: Medicaid Other

## 2023-11-07 ENCOUNTER — Encounter: Payer: Self-pay | Admitting: Pulmonary Disease

## 2023-11-07 ENCOUNTER — Ambulatory Visit (INDEPENDENT_AMBULATORY_CARE_PROVIDER_SITE_OTHER): Payer: Medicaid Other | Admitting: Women's Health

## 2023-11-07 ENCOUNTER — Encounter: Payer: Self-pay | Admitting: Women's Health

## 2023-11-07 ENCOUNTER — Encounter: Payer: Medicaid Other | Admitting: *Deleted

## 2023-11-07 VITALS — BP 111/66 | HR 68 | Wt 134.6 lb

## 2023-11-07 DIAGNOSIS — Z1332 Encounter for screening for maternal depression: Secondary | ICD-10-CM | POA: Diagnosis not present

## 2023-11-07 DIAGNOSIS — Z3480 Encounter for supervision of other normal pregnancy, unspecified trimester: Secondary | ICD-10-CM | POA: Diagnosis not present

## 2023-11-07 DIAGNOSIS — Z98891 History of uterine scar from previous surgery: Secondary | ICD-10-CM

## 2023-11-07 DIAGNOSIS — O09891 Supervision of other high risk pregnancies, first trimester: Secondary | ICD-10-CM

## 2023-11-07 DIAGNOSIS — Z3A12 12 weeks gestation of pregnancy: Secondary | ICD-10-CM

## 2023-11-07 DIAGNOSIS — Z3682 Encounter for antenatal screening for nuchal translucency: Secondary | ICD-10-CM

## 2023-11-07 DIAGNOSIS — F419 Anxiety disorder, unspecified: Secondary | ICD-10-CM | POA: Diagnosis not present

## 2023-11-07 DIAGNOSIS — Z3481 Encounter for supervision of other normal pregnancy, first trimester: Secondary | ICD-10-CM

## 2023-11-07 DIAGNOSIS — Z131 Encounter for screening for diabetes mellitus: Secondary | ICD-10-CM | POA: Diagnosis not present

## 2023-11-07 DIAGNOSIS — Z3490 Encounter for supervision of normal pregnancy, unspecified, unspecified trimester: Secondary | ICD-10-CM | POA: Insufficient documentation

## 2023-11-07 DIAGNOSIS — F32A Depression, unspecified: Secondary | ICD-10-CM

## 2023-11-07 DIAGNOSIS — O09899 Supervision of other high risk pregnancies, unspecified trimester: Secondary | ICD-10-CM

## 2023-11-07 MED ORDER — BLOOD PRESSURE MONITOR MISC: 0 refills | Status: AC

## 2023-11-07 NOTE — Progress Notes (Signed)
[place] 12+2 wks measurements c/w dates,CRL 65.83 mm,normal ovaries,FHR 161 bpm,NT 1.9 mm,NB present,anterior placenta

## 2023-11-07 NOTE — Patient Instructions (Signed)
Jasmine Pierce, thank you for choosing our office today! We appreciate the opportunity to meet your healthcare needs. You may receive a short survey by mail, e-mail, or through [employer]. If you are happy with your care we would appreciate if you could take just a few minutes to complete the survey questions. We read all of your comments and take your feedback very seriously. Thank you again for choosing our office.  [facility] for [employer] [facility] Team at [facility]  [facility] at [facility] ([address] [place], [place] [address]) Entrance C, located off of [address] Free 24/7 valet parking   Nausea & Vomiting Have saltine crackers or pretzels by your bed and eat a few bites before you raise your head out of bed in the morning Eat small frequent meals throughout the day instead of large meals Drink plenty of fluids throughout the day to stay hydrated, just don't drink a lot of fluids with your meals.  This can make your stomach fill up faster making you feel sick Do not brush your teeth right after you eat Products with real ginger are good for nausea, like ginger ale and ginger hard candy Make sure it says made with real ginger! Sucking on sour candy like lemon heads is also good for nausea If your prenatal vitamins make you nauseated, take them at night so you will sleep through the nausea Sea Bands If you feel like you need medicine for the nausea & vomiting please let us know If you are unable to keep any fluids or food down please let us know   Constipation Drink plenty of fluid, preferably water, throughout the day Eat foods high in fiber such as fruits, vegetables, and grains Exercise, such as walking, is a good way to keep your bowels regular Drink warm fluids, especially warm prune juice, or decaf coffee Eat a 1/2 cup of real oatmeal (not instant), 1/2 cup applesauce, and 1/2-1 cup warm prune juice every day If needed, you may take Colace (docusate sodium) stool softener  once or twice a day to help keep the stool soft.  If you still are having problems with constipation, you may take Miralax once daily as needed to help keep your bowels regular.   Home Blood Pressure Monitoring for Patients   Your provider has recommended that you check your blood pressure (BP) at least once a week at home. If you do not have a blood pressure cuff at home, one will be provided for you. Contact your provider if you have not received your monitor within 1 week.   Helpful Tips for Accurate Home Blood Pressure Checks  Don't smoke, exercise, or drink caffeine 30 minutes before checking your BP Use the restroom before checking your BP (a full bladder can raise your pressure) Relax in a comfortable upright chair Feet on the ground Left arm resting comfortably on a flat surface at the level of your heart Legs uncrossed Back supported Sit quietly and don't talk Place the cuff on your bare arm Adjust snuggly, so that only two fingertips can fit between your skin and the top of the cuff Check 2 readings separated by at least one minute Keep a log of your BP readings For a visual, please reference this diagram: http://ccnc.care/bpdiagram  Provider Name: [facility]     Phone: [phone]  Zone 1: ALL CLEAR  Continue to monitor your symptoms:  BP reading is less than 140 (top number) or less than 90 (bottom  number)  No right upper stomach pain No headaches or seeing spots No feeling nauseated or throwing up No swelling in face and hands  Zone 2: CAUTION Call your doctor's office for any of the following:  BP reading is greater than 140 (top number) or greater than 90 (bottom number)  Stomach pain under your ribs in the middle or right side Headaches or seeing spots Feeling nauseated or throwing up Swelling in face and hands  Zone 3: EMERGENCY  Seek immediate medical care if you have any of the following:  BP reading is greater than160 (top number) or greater than  110 (bottom number) Severe headaches not improving with Tylenol Serious difficulty catching your breath Any worsening symptoms from Zone 2    First Trimester of Pregnancy The first trimester of pregnancy is from week 1 until the end of week 12 (months 1 through 3). A week after a sperm fertilizes an egg, the egg will implant on the wall of the uterus. This embryo will begin to develop into a baby. Genes from you and your partner are forming the baby. The female genes determine whether the baby is a boy or a girl. At 6-8 weeks, the eyes and face are formed, and the heartbeat can be seen on ultrasound. At the end of 12 weeks, all the baby's organs are formed.  Now that you are pregnant, you will want to do everything you can to have a healthy baby. Two of the most important things are to get good prenatal care and to follow your health care provider's instructions. Prenatal care is all the medical care you receive before the baby's birth. This care will help prevent, find, and treat any problems during the pregnancy and childbirth. BODY CHANGES Your body goes through many changes during pregnancy. The changes vary from woman to woman.  You may gain or lose a couple of pounds at first. You may feel sick to your stomach (nauseous) and throw up (vomit). If the vomiting is uncontrollable, call your health care provider. You may tire easily. You may develop headaches that can be relieved by medicines approved by your health care provider. You may urinate more often. Painful urination may mean you have a bladder infection. You may develop heartburn as a result of your pregnancy. You may develop constipation because certain hormones are causing the muscles that push waste through your intestines to slow down. You may develop hemorrhoids or swollen, bulging veins (varicose veins). Your breasts may begin to grow larger and become tender. Your nipples may stick out more, and the tissue that surrounds them  (areola) may become darker. Your gums may bleed and may be sensitive to brushing and flossing. Dark spots or blotches (chloasma, mask of pregnancy) may develop on your face. This will likely fade after the baby is born. Your menstrual periods will stop. You may have a loss of appetite. You may develop cravings for certain kinds of food. You may have changes in your emotions from day to day, such as being excited to be pregnant or being concerned that something may go wrong with the pregnancy and baby. You may have more vivid and strange dreams. You may have changes in your hair. These can include thickening of your hair, rapid growth, and changes in texture. Some women also have hair loss during or after pregnancy, or hair that feels dry or thin. Your hair will most likely return to normal after your baby is born. WHAT TO EXPECT AT YOUR PRENATAL  VISITS During a routine prenatal visit: You will be weighed to make sure you and the baby are growing normally. Your blood pressure will be taken. Your abdomen will be measured to track your baby's growth. The fetal heartbeat will be listened to starting around week 10 or 12 of your pregnancy. Test results from any previous visits will be discussed. Your health care provider may ask you: How you are feeling. If you are feeling the baby move. If you have had any abnormal symptoms, such as leaking fluid, bleeding, severe headaches, or abdominal cramping. If you have any questions. Other tests that may be performed during your first trimester include: Blood tests to find your blood type and to check for the presence of any previous infections. They will also be used to check for low iron levels (anemia) and Rh antibodies. Later in the pregnancy, blood tests for diabetes will be done along with other tests if problems develop. Urine tests to check for infections, diabetes, or protein in the urine. An ultrasound to confirm the proper growth and development  of the baby. An amniocentesis to check for possible genetic problems. Fetal screens for spina bifida and Down syndrome. You may need other tests to make sure you and the baby are doing well. HOME CARE INSTRUCTIONS  Medicines Follow your health care provider's instructions regarding medicine use. Specific medicines may be either safe or unsafe to take during pregnancy. Take your prenatal vitamins as directed. If you develop constipation, try taking a stool softener if your health care provider approves. Diet Eat regular, well-balanced meals. Choose a variety of foods, such as meat or vegetable-based protein, fish, milk and low-fat dairy products, vegetables, fruits, and whole grain breads and cereals. Your health care provider will help you determine the amount of weight gain that is right for you. Avoid raw meat and uncooked cheese. These carry germs that can cause birth defects in the baby. Eating four or five small meals rather than three large meals a day may help relieve nausea and vomiting. If you start to feel nauseous, eating a few soda crackers can be helpful. Drinking liquids between meals instead of during meals also seems to help nausea and vomiting. If you develop constipation, eat more high-fiber foods, such as fresh vegetables or fruit and whole grains. Drink enough fluids to keep your urine clear or pale yellow. Activity and Exercise Exercise only as directed by your health care provider. Exercising will help you: Control your weight. Stay in shape. Be prepared for labor and delivery. Experiencing pain or cramping in the lower abdomen or low back is a good sign that you should stop exercising. Check with your health care provider before continuing normal exercises. Try to avoid standing for long periods of time. Move your legs often if you must stand in one place for a long time. Avoid heavy lifting. Wear low-heeled shoes, and practice good posture. You may continue to have sex  unless your health care provider directs you otherwise. Relief of Pain or Discomfort Wear a good support bra for breast tenderness.   Take warm sitz baths to soothe any pain or discomfort caused by hemorrhoids. Use hemorrhoid cream if your health care provider approves.   Rest with your legs elevated if you have leg cramps or low back pain. If you develop varicose veins in your legs, wear support hose. Elevate your feet for 15 minutes, 3-4 times a day. Limit salt in your diet. Prenatal Care Schedule your prenatal visits by the  twelfth week of pregnancy. They are usually scheduled monthly at first, then more often in the last 2 months before delivery. Write down your questions. Take them to your prenatal visits. Keep all your prenatal visits as directed by your health care provider. Safety Wear your seat belt at all times when driving. Make a list of emergency phone numbers, including numbers for family, friends, the hospital, and police and fire departments. General Tips Ask your health care provider for a referral to a local prenatal education class. Begin classes no later than at the beginning of month 6 of your pregnancy. Ask for help if you have counseling or nutritional needs during pregnancy. Your health care provider can offer advice or refer you to specialists for help with various needs. Do not use hot tubs, steam rooms, or saunas. Do not douche or use tampons or scented sanitary pads. Do not cross your legs for long periods of time. Avoid cat litter boxes and soil used by cats. These carry germs that can cause birth defects in the baby and possibly loss of the fetus by miscarriage or stillbirth. Avoid all smoking, herbs, alcohol, and medicines not prescribed by your health care provider. Chemicals in these affect the formation and growth of the baby. Schedule a dentist appointment. At home, brush your teeth with a soft toothbrush and be gentle when you floss. SEEK MEDICAL CARE IF:   You have dizziness. You have mild pelvic cramps, pelvic pressure, or nagging pain in the abdominal area. You have persistent nausea, vomiting, or diarrhea. You have a bad smelling vaginal discharge. You have pain with urination. You notice increased swelling in your face, hands, legs, or ankles. SEEK IMMEDIATE MEDICAL CARE IF:  You have a fever. You are leaking fluid from your vagina. You have spotting or bleeding from your vagina. You have severe abdominal cramping or pain. You have rapid weight gain or loss. You vomit blood or material that looks like coffee grounds. You are exposed to [place] measles and have never had them. You are exposed to fifth disease or chickenpox. You develop a severe headache. You have shortness of breath. You have any kind of trauma, such as from a fall or a car accident. Document Released: 09/21/2001 Document Revised: 02/11/2014 Document Reviewed: 08/07/2013 [facility] Patient Information 2015 [place], [place]. This information is not intended to replace advice given to you by your health care provider. Make sure you discuss any questions you have with your health care provider.

## 2023-11-07 NOTE — Progress Notes (Signed)
INITIAL OBSTETRICAL VISIT Patient name: Jasmine Pierce MRN [id number]  Date of birth: September 19, 2001 Chief Complaint:   Initial Prenatal Visit  History of Present Illness:   Jasmine Pierce is a 23 y.o. [id number] female at [redacted]w[redacted]d by LMP c/w u/s at 8 weeks with an Estimated Date of Delivery: 05/19/24 being seen today for her initial obstetrical visit.   Patient's last menstrual period was 08/13/2023. Her obstetrical history is significant for  SAB x 1, 36.5w PCS for twins, A breech, B w/ FGR, both w/ elevated UAD, then VBAC w/ unknown FGR (tx to FT at 37.4 and was in early labor) .   Today she reports no complaints.  Last pap 01/26/23. Results were: NILM w/ HRHPV not done     11/07/2023   11:02 AM 01/26/2023    3:53 PM 05/10/2022    9:52 AM 03/31/2020   10:39 AM 07/27/2019   11:49 AM  Depression screen PHQ 2/9  Decreased Interest 0 1 0 0 1  Down, Depressed, Hopeless 0 1 0 1 0  PHQ - 2 Score 0 2 0 1 1  Altered sleeping 0 1 1 0 3  Tired, decreased energy 1 2 1 1 1   Change in appetite 0 3 0 1 1  Feeling bad or failure about yourself  0 1 0 0 0  Trouble concentrating 0 2 0 0 0  Moving slowly or fidgety/restless 0 1 0 0 0  Suicidal thoughts 0 0 0 0 0  PHQ-9 Score 1 12 2 3 6   Difficult doing work/chores    Somewhat difficult         11/07/2023   11:02 AM 01/26/2023    3:53 PM 05/10/2022    9:52 AM 03/31/2020   10:39 AM  GAD 7 : Generalized Anxiety Score  Nervous, Anxious, on Edge 0 1 1 0  Control/stop worrying 0 2 1 1   Worry too much - different things 0 2 0 1  Trouble relaxing 0 1 1 1   Restless 0 1 0 0  Easily annoyed or irritable 0 3 1 2   Afraid - awful might happen 0 1 1 2   Total GAD 7 Score 0 11 5 7   Anxiety Difficulty    Not difficult at all     Review of Systems:   Pertinent items are noted in HPI Denies cramping/contractions, leakage of fluid, vaginal bleeding, abnormal vaginal discharge w/ itching/odor/irritation, headaches, visual changes, shortness of breath, chest pain,  abdominal pain, severe nausea/vomiting, or problems with urination or bowel movements unless otherwise stated above.  Pertinent History Reviewed:  Reviewed past medical,surgical, social, obstetrical and family history.  Reviewed problem list, medications and allergies. OB History  Gravida Para Term Preterm AB Living  4 2 1 1 1 3   SAB IAB Ectopic Multiple Live Births  1 0 0 1 3    # Outcome Date GA Lbr Len/2nd Weight Sex Type Anes PTL Lv  4 Current           3 Term 05/10/22 [redacted]w[redacted]d / 00:16 5 lb 4.1 oz (2.385 kg) M VBAC EPI  LIV     Birth Comments: wnl  2A Preterm 01/09/20 [redacted]w[redacted]d  5 lb 9.8 oz (2.545 kg) M CS-LTranv Spinal  LIV  2B Preterm 01/09/20 [redacted]w[redacted]d  4 lb 11.3 oz (2.135 kg) M CS-LTranv Spinal  LIV  1 SAB            Physical Assessment:   Vitals:   11/07/23 1136  BP: 111/66  Pulse: 68  Weight: 134 lb 9.6 oz (61.1 kg)  Body mass index is 23.84 kg/m.       Physical Examination:  General appearance - well appearing, and in no distress  Mental status - alert, oriented to person, place, and time  Psych:  She has a normal mood and affect  Skin - warm and dry, normal color, no suspicious lesions noted  Chest - effort normal, all lung fields clear to auscultation bilaterally  Heart - normal rate and regular rhythm  Abdomen - soft, nontender  Extremities:  No swelling or varicosities noted  Chaperone: N/A    TODAY'S NT [place] 12+2 wks measurements c/w dates,CRL 65.83 mm,normal ovaries,FHR 161 bpm,NT 1.9 mm,NB present,anterior placenta   No results found for this or any previous visit (from the past 24 hours).  Assessment & Plan:  1) Low-Risk Pregnancy [id number] at [redacted]w[redacted]d with an Estimated Date of Delivery: 05/19/24   2) Initial OB visit  3) Prev c/s at 36.5w for twins, A breech, B w/ FGR, both w/ elevated UAD> then successful VBAC, wants TOLAC  4) H/O FGR x 2> twin B, then last baby, plan serial EFW u/s  5) Dep/anx> no meds, doing well  Meds:  Meds ordered this encounter   Medications   Blood Pressure Monitor MISC    Sig: For regular home bp monitoring during pregnancy    Dispense:  1 each    Refill:  0    Z34.81 Please mail to patient    Initial labs obtained Continue prenatal vitamins Reviewed n/v relief measures and warning s/s to report Reviewed recommended weight gain based on pre-gravid BMI Encouraged well-balanced diet Genetic & carrier screening discussed: requests Panorama and NT/IT, Inheritest neg prev preg Ultrasound discussed; fetal survey: requested CCNC completed> form faxed if has or is planning to apply for medicaid The nature of [place] [facility] [employer] with multiple MDs and other Advanced Practice Providers was explained to patient; also emphasized that fellows, residents, and students are part of our team. Does not have home bp cuff. Office bp cuff given: no. Rx sent: yes. Check bp weekly, let us know if consistently >140/90.   Follow-up: Return in about 4 weeks (around 12/05/2023) for LROB, 2nd IT, CNM, in person; then 7wks from now anatomy u/s and LROB w/ CNM.   Orders Placed This Encounter  Procedures   Urine Culture   GC/Chlamydia Probe Amp   Integrated 1   Hemoglobin A1c   PANORAMA PRENATAL TEST   CBC/D/Plt+RPR+Rh+ABO+RubIgG...    Cheral Marker CNM, [facility] 11/07/2023 12:43 PM

## 2023-11-08 DIAGNOSIS — Z3480 Encounter for supervision of other normal pregnancy, unspecified trimester: Secondary | ICD-10-CM | POA: Diagnosis not present

## 2023-11-08 LAB — INTEGRATED 1

## 2023-11-09 LAB — CBC/D/PLT+RPR+RH+ABO+RUBIGG...
Basophils Absolute: 0 10*3/uL (ref 0.0–0.2)
Basos: 0 %
EOS (ABSOLUTE): 0.1 10*3/uL (ref 0.0–0.4)
Eos: 1 %
Hematocrit: 40.5 % (ref 34.0–46.6)
Hemoglobin: 13.5 g/dL (ref 11.1–15.9)
Immature Grans (Abs): 0 10*3/uL (ref 0.0–0.1)
Immature Granulocytes: 0 %
Lymphocytes Absolute: 1.6 10*3/uL (ref 0.7–3.1)
Lymphs: 17 %
MCH: 28.8 pg (ref 26.6–33.0)
MCHC: 33.3 g/dL (ref 31.5–35.7)
MCV: 86 fL (ref 79–97)
Monocytes Absolute: 0.4 10*3/uL (ref 0.1–0.9)
Monocytes: 4 %
Neutrophils Absolute: 7.4 10*3/uL — ABNORMAL HIGH (ref 1.4–7.0)
Neutrophils: 78 %
Platelets: 226 10*3/uL (ref 150–450)
RBC: 4.69 x10E6/uL (ref 3.77–5.28)
RDW: 13.7 % (ref 11.7–15.4)
Rubella Antibodies, IGG: 3.59 {index} (ref >0.99)
WBC: 9.6 10*3/uL (ref 3.4–10.8)

## 2023-11-09 LAB — INTEGRATED 1
Crown Rump Length: 65.8 mm
Gest. Age on Collection Date: 12.7 wk
PAPP-A Value: 906.6 ng/mL
Race: 1
Sonographer ID#: 25.2 a
Sonographer ID#: 309760
Weight: 1.9 mm
Weight: 135 [lb_av]

## 2023-11-09 LAB — HCV INTERPRETATION

## 2023-11-09 LAB — URINE CULTURE

## 2023-11-09 LAB — HEMOGLOBIN A1C
Est. average glucose Bld gHb Est-mCnc: 105 mg/dL
Hgb A1c MFr Bld: 5.3 % (ref 4.8–5.6)

## 2023-11-09 LAB — GC/CHLAMYDIA PROBE AMP
Chlamydia trachomatis, NAA: NEGATIVE
Neisseria Gonorrhoeae by PCR: NEGATIVE

## 2023-11-17 ENCOUNTER — Encounter: Payer: Self-pay | Admitting: Women's Health

## 2023-11-17 LAB — PANORAMA PRENATAL TEST FULL PANEL:PANORAMA TEST PLUS 5 ADDITIONAL MICRODELETIONS: FETAL FRACTION: 9.4

## 2023-11-18 ENCOUNTER — Encounter: Payer: Self-pay | Admitting: Women's Health

## 2023-11-21 ENCOUNTER — Encounter: Payer: Self-pay | Admitting: Women's Health

## 2023-11-24 ENCOUNTER — Other Ambulatory Visit: Payer: Self-pay | Admitting: Women's Health

## 2023-11-24 ENCOUNTER — Encounter: Payer: Self-pay | Admitting: Women's Health

## 2023-11-24 MED ORDER — PRENATAL PLUS 27-1 MG PO TABS: ORAL_TABLET | ORAL | 3 refills | Status: AC

## 2023-12-05 ENCOUNTER — Encounter: Payer: Self-pay | Admitting: Women's Health

## 2023-12-05 ENCOUNTER — Ambulatory Visit (INDEPENDENT_AMBULATORY_CARE_PROVIDER_SITE_OTHER): Payer: Medicaid Other | Admitting: Women's Health

## 2023-12-05 VITALS — BP 100/70 | HR 80 | Wt 139.0 lb

## 2023-12-05 DIAGNOSIS — Z3A16 16 weeks gestation of pregnancy: Secondary | ICD-10-CM | POA: Diagnosis not present

## 2023-12-05 DIAGNOSIS — Z363 Encounter for antenatal screening for malformations: Secondary | ICD-10-CM

## 2023-12-05 DIAGNOSIS — Z1379 Encounter for other screening for genetic and chromosomal anomalies: Secondary | ICD-10-CM

## 2023-12-05 DIAGNOSIS — Z3482 Encounter for supervision of other normal pregnancy, second trimester: Secondary | ICD-10-CM | POA: Diagnosis not present

## 2023-12-05 DIAGNOSIS — Z3480 Encounter for supervision of other normal pregnancy, unspecified trimester: Secondary | ICD-10-CM

## 2023-12-05 NOTE — Progress Notes (Signed)
 LOW-RISK PREGNANCY VISIT Patient name: Jasmine Pierce MRN [id number]  Date of birth: 15-Mar-2001 Chief Complaint:   Routine Prenatal Visit (2nd IT)  History of Present Illness:   Jasmine Pierce is a 23 y.o. [phone] female at [redacted]w[redacted]d with an Estimated Date of Delivery: 05/19/24 being seen today for ongoing management of a low-risk pregnancy.   Today she reports  intermittent swelling Rt foot, Rt sided sciatica. Some pulling pain above c/s incision-discussed all . Contractions: Not present.  .  Movement: Absent. denies leaking of fluid.     11/07/2023   11:02 AM 01/26/2023    3:53 PM 05/10/2022    9:52 AM 03/31/2020   10:39 AM 07/27/2019   11:49 AM  Depression screen PHQ 2/9  Decreased Interest 0 1 0 0 1  Down, Depressed, Hopeless 0 1 0 1 0  PHQ - 2 Score 0 2 0 1 1  Altered sleeping 0 1 1 0 3  Tired, decreased energy 1 2 1 1 1   Change in appetite 0 3 0 1 1  Feeling bad or failure about yourself  0 1 0 0 0  Trouble concentrating 0 2 0 0 0  Moving slowly or fidgety/restless 0 1 0 0 0  Suicidal thoughts 0 0 0 0 0  PHQ-9 Score 1 12 2 3 6   Difficult doing work/chores    Somewhat difficult         11/07/2023   11:02 AM 01/26/2023    3:53 PM 05/10/2022    9:52 AM 03/31/2020   10:39 AM  GAD 7 : Generalized Anxiety Score  Nervous, Anxious, on Edge 0 1 1 0  Control/stop worrying 0 2 1 1   Worry too much - different things 0 2 0 1  Trouble relaxing 0 1 1 1   Restless 0 1 0 0  Easily annoyed or irritable 0 3 1 2   Afraid - awful might happen 0 1 1 2   Total GAD 7 Score 0 11 5 7   Anxiety Difficulty    Not difficult at all      Review of Systems:   Pertinent items are noted in HPI Denies abnormal vaginal discharge w/ itching/odor/irritation, headaches, visual changes, shortness of breath, chest pain, abdominal pain, severe nausea/vomiting, or problems with urination or bowel movements unless otherwise stated above. Pertinent History Reviewed:  Reviewed past medical,surgical, social,  obstetrical and family history.  Reviewed problem list, medications and allergies. Physical Assessment:   Vitals:   12/05/23 1206  BP: 100/70  Pulse: 80  Weight: 139 lb (63 kg)  Body mass index is 24.62 kg/m.        Physical Examination:   General appearance: Well appearing, and in no distress  Mental status: Alert, oriented to person, place, and time  Skin: Warm & dry  Cardiovascular: Normal heart rate noted  Respiratory: Normal respiratory effort, no distress  Abdomen: Soft, gravid, nontender  Pelvic: Cervical exam deferred         Extremities: Edema: Trace  Fetal Status: Fetal Heart Rate (bpm): 160   Movement: Absent    Chaperone: N/A   No results found for this or any previous visit (from the past 24 hours).  Assessment & Plan:  1) Low-risk pregnancy [id number] at [redacted]w[redacted]d with an Estimated Date of Delivery: 05/19/24   2) Prev c/s, then VBAC, plans TOLAC  3) H/O FGR x 2  4) Rt sciatica> gave printed exercises   Meds: No orders of the defined types were placed in this encounter.  Labs/procedures today: 2nd IT  Plan:  Continue routine obstetrical care  Next visit: prefers will be in person for u/s     Reviewed: Preterm labor symptoms and general obstetric precautions including but not limited to vaginal bleeding, contractions, leaking of fluid and fetal movement were reviewed in detail with the patient.  All questions were answered. Does have home bp cuff. Office bp cuff given: not applicable. Check bp weekly, let us know if consistently >140 and/or >90.  Follow-up: Return for As scheduled.  Future Appointments  Date Time Provider Department Center  12/27/2023 10:45 AM [facility] 2 [facility] None  12/27/2023 11:50 AM Myna Hidalgo, DO [facility]    Orders Placed This Encounter  Procedures   US OB Comp + 14 Wk   INTEGRATED 2   Cheral Marker CNM, [facility] 12/05/2023 12:28 PM

## 2023-12-05 NOTE — Patient Instructions (Signed)
 Bailea, thank you for choosing our office today! We appreciate the opportunity to meet your healthcare needs. You may receive a short survey by mail, e-mail, or through [employer]. If you are happy with your care we would appreciate if you could take just a few minutes to complete the survey questions. We read all of your comments and take your feedback very seriously. Thank you again for choosing our office.  Center for [employer] Team at [facility] ([address] [place], [place] [address]) Entrance C, located off of [address] Free 24/7 valet parking  Go to [email or web address] to register for FREE online childbirth classes  Call the office [phone]) or go to [facility] if: You begin to severe cramping Your water breaks.  Sometimes it is a big gush of fluid, sometimes it is just a trickle that keeps getting your panties wet or running down your legs You have vaginal bleeding.  It is normal to have a small amount of spotting if your cervix was checked.   [facility] Pediatricians/Family Doctors [facility]): [address] Colette Ribas, [phone]           [facility]: [address], [phone]                [facility]): [address], [phone] (call to ask if accepting patients) [facility] Department: [address], [place], [phone]    [facility] Pediatricians/Family Doctors [facility]): [phone] [address], [phone] [facility]: [address] [place], [phone] [facility]: [address], [phone]  [facility] Doctors  [facility]): [phone] [facility]: [address], [phone]   [facility] Doctors [facility]: [address], [phone]  [facility] Doctors  [facility]: [phone], [phone]  Home Blood Pressure Monitoring for Patients   Your provider has recommended that you check your blood pressure (BP) at least once a week at home. If you do not have a blood pressure cuff at home, one will be provided for you. Contact your provider if you have not received your monitor within 1 week.   Helpful Tips for Accurate Home Blood Pressure Checks  Don't smoke, exercise, or drink caffeine 30 minutes before checking your BP Use the restroom before checking your BP (a full bladder can raise your pressure) Relax in a comfortable upright chair Feet on the ground Left arm resting comfortably on a flat surface at the level of your heart Legs uncrossed Back supported Sit quietly and don't talk Place the cuff on your bare arm Adjust snuggly, so that only two fingertips can fit between your skin and the top of the cuff Check 2 readings separated by at least one minute Keep a log of your BP readings For a visual, please reference this diagram: http://ccnc.care/bpdiagram  Provider Name: [facility]     Phone: [phone]  Zone 1: ALL CLEAR  Continue to monitor your symptoms:  BP reading is less than 140 (top number) or less than 90 (bottom number)  No right upper stomach pain No headaches or seeing spots No feeling nauseated or throwing up No swelling in face and hands  Zone 2: CAUTION Call your doctor's office for any of the following:  BP reading is greater than 140 (top number) or greater than  90 (bottom number)  Stomach pain under your ribs in the middle or right side Headaches or seeing spots Feeling nauseated or throwing up Swelling in face and hands  Zone 3: EMERGENCY  Seek immediate medical care if you have any of the following:  BP reading is greater than160 (top number) or greater than 110 (bottom number) Severe headaches not improving with Tylenol Serious difficulty catching your breath Any worsening symptoms from  Zone 2     Second Trimester of Pregnancy The second trimester is from week 14 through week 27 (months 4 through 6). The second trimester is often a time when you feel your best. Your body has adjusted to being pregnant, and you begin to feel better physically. Usually, morning sickness has lessened or quit completely, you may have more energy, and you may have an increase in appetite. The second trimester is also a time when the fetus is growing rapidly. At the end of the sixth month, the fetus is about 9 inches long and weighs about 1 pounds. You will likely begin to feel the baby move (quickening) between 16 and 20 weeks of pregnancy. Body changes during your second trimester Your body continues to go through many changes during your second trimester. The changes vary from woman to woman. Your weight will continue to increase. You will notice your lower abdomen bulging out. You may begin to get stretch marks on your hips, abdomen, and breasts. You may develop headaches that can be relieved by medicines. The medicines should be approved by your health care provider. You may urinate more often because the fetus is pressing on your bladder. You may develop or continue to have heartburn as a result of your pregnancy. You may develop constipation because certain hormones are causing the muscles that push waste through your intestines to slow down. You may develop hemorrhoids or swollen, bulging veins (varicose veins). You may have back pain. This is caused by: Weight gain. Pregnancy hormones that are relaxing the joints in your pelvis. A shift in weight and the muscles that support your balance. Your breasts will continue to grow and they will continue to become tender. Your gums may bleed and may be sensitive to brushing and flossing. Dark spots or blotches (chloasma, mask of pregnancy) may develop on your face. This will likely fade after the baby is born. A dark line from your belly button to  the pubic area (linea nigra) may appear. This will likely fade after the baby is born. You may have changes in your hair. These can include thickening of your hair, rapid growth, and changes in texture. Some women also have hair loss during or after pregnancy, or hair that feels dry or thin. Your hair will most likely return to normal after your baby is born.  What to expect at prenatal visits During a routine prenatal visit: You will be weighed to make sure you and the fetus are growing normally. Your blood pressure will be taken. Your abdomen will be measured to track your baby's growth. The fetal heartbeat will be listened to. Any test results from the previous visit will be discussed.  Your health care provider may ask you: How you are feeling. If you are feeling the baby move. If you have had any abnormal symptoms, such as leaking fluid, bleeding, severe headaches, or abdominal cramping. If you are using any tobacco products, including cigarettes, chewing tobacco, and electronic cigarettes. If you have any questions.  Other tests that may be performed during  your second trimester include: Blood tests that check for: Low iron levels (anemia). High blood sugar that affects pregnant women (gestational diabetes) between 76 and 28 weeks. Rh antibodies. This is to check for a protein on red blood cells (Rh factor). Urine tests to check for infections, diabetes, or protein in the urine. An ultrasound to confirm the proper growth and development of the baby. An amniocentesis to check for possible genetic problems. Fetal screens for spina bifida and Down syndrome. HIV (human immunodeficiency virus) testing. Routine prenatal testing includes screening for HIV, unless you choose not to have this test.  Follow these instructions at home: Medicines Follow your health care provider's instructions regarding medicine use. Specific medicines may be either safe or unsafe to take during  pregnancy. Take a prenatal vitamin that contains at least 600 micrograms (mcg) of folic acid. If you develop constipation, try taking a stool softener if your health care provider approves. Eating and drinking Eat a balanced diet that includes fresh fruits and vegetables, whole grains, good sources of protein such as meat, eggs, or tofu, and low-fat dairy. Your health care provider will help you determine the amount of weight gain that is right for you. Avoid raw meat and uncooked cheese. These carry germs that can cause birth defects in the baby. If you have low calcium intake from food, talk to your health care provider about whether you should take a daily calcium supplement. Limit foods that are high in fat and processed sugars, such as fried and sweet foods. To prevent constipation: Drink enough fluid to keep your urine clear or pale yellow. Eat foods that are high in fiber, such as fresh fruits and vegetables, whole grains, and beans. Activity Exercise only as directed by your health care provider. Most women can continue their usual exercise routine during pregnancy. Try to exercise for 30 minutes at least 5 days a week. Stop exercising if you experience uterine contractions. Avoid heavy lifting, wear low heel shoes, and practice good posture. A sexual relationship may be continued unless your health care provider directs you otherwise. Relieving pain and discomfort Wear a good support bra to prevent discomfort from breast tenderness. Take warm sitz baths to soothe any pain or discomfort caused by hemorrhoids. Use hemorrhoid cream if your health care provider approves. Rest with your legs elevated if you have leg cramps or low back pain. If you develop varicose veins, wear support hose. Elevate your feet for 15 minutes, 3-4 times a day. Limit salt in your diet. Prenatal Care Write down your questions. Take them to your prenatal visits. Keep all your prenatal visits as told by your health  care provider. This is important. Safety Wear your seat belt at all times when driving. Make a list of emergency phone numbers, including numbers for family, friends, the hospital, and police and fire departments. General instructions Ask your health care provider for a referral to a local prenatal education class. Begin classes no later than the beginning of month 6 of your pregnancy. Ask for help if you have counseling or nutritional needs during pregnancy. Your health care provider can offer advice or refer you to specialists for help with various needs. Do not use hot tubs, steam rooms, or saunas. Do not douche or use tampons or scented sanitary pads. Do not cross your legs for long periods of time. Avoid cat litter boxes and soil used by cats. These carry germs that can cause birth defects in the baby and possibly loss of the  fetus by miscarriage or stillbirth. Avoid all smoking, herbs, alcohol, and unprescribed drugs. Chemicals in these products can affect the formation and growth of the baby. Do not use any products that contain nicotine or tobacco, such as cigarettes and e-cigarettes. If you need help quitting, ask your health care provider. Visit your dentist if you have not gone yet during your pregnancy. Use a soft toothbrush to brush your teeth and be gentle when you floss. Contact a health care provider if: You have dizziness. You have mild pelvic cramps, pelvic pressure, or nagging pain in the abdominal area. You have persistent nausea, vomiting, or diarrhea. You have a bad smelling vaginal discharge. You have pain when you urinate. Get help right away if: You have a fever. You are leaking fluid from your vagina. You have spotting or bleeding from your vagina. You have severe abdominal cramping or pain. You have rapid weight gain or weight loss. You have shortness of breath with chest pain. You notice sudden or extreme swelling of your face, hands, ankles, feet, or legs. You  have not felt your baby move in over an hour. You have severe headaches that do not go away when you take medicine. You have vision changes. Summary The second trimester is from week 14 through week 27 (months 4 through 6). It is also a time when the fetus is growing rapidly. Your body goes through many changes during pregnancy. The changes vary from woman to woman. Avoid all smoking, herbs, alcohol, and unprescribed drugs. These chemicals affect the formation and growth your baby. Do not use any tobacco products, such as cigarettes, chewing tobacco, and e-cigarettes. If you need help quitting, ask your health care provider. Contact your health care provider if you have any questions. Keep all prenatal visits as told by your health care provider. This is important. This information is not intended to replace advice given to you by your health care provider. Make sure you discuss any questions you have with your health care provider. Document Released: 09/21/2001 Document Revised: 03/04/2016 Document Reviewed: 11/28/2012 Elsevier Interactive Patient Education  2017 [employer] Inc.   Sciatica Rehab Ask your health care provider which exercises are safe for you. Do exercises exactly as told by your health care provider and adjust them as directed. It is normal to feel mild stretching, pulling, tightness, or discomfort as you do these exercises. Stop right away if you feel sudden pain or your pain gets worse. Do not begin these exercises until told by your health care provider. Stretching and range-of-motion exercises These exercises warm up your muscles and joints and improve the movement and flexibility of your hips and back. These exercises also help to relieve pain, numbness, and tingling. Sciatic nerve glide  Sit in a chair with your head facing down toward your chest. Place your hands behind your back. Let your shoulders slump forward. Slowly straighten one of your legs while you tilt your head  back as if you are looking toward the ceiling. Only straighten your leg as far as you can without making your symptoms worse. Hold this position for __________ seconds. Slowly return your leg and head back to the starting position. Repeat with your other leg. Repeat __________ times. Complete this exercise __________ times a day. Knee to chest with hip adduction and internal rotation  Lie on your back on a firm surface with both legs straight. Bend one of your knees and move it up toward your chest until you feel a gentle stretch in  your lower back and buttock. Then, move your knee toward the shoulder that is on the opposite side from your leg. This is hip adduction and internal rotation. Hold your leg in this position by holding on to the front of your knee. Hold this position for __________ seconds. Slowly return to the starting position. Repeat with your other leg. Repeat __________ times. Complete this exercise __________ times a day. Prone extension on elbows  Lie on your abdomen on a firm surface. A bed may be too soft for this exercise. Prop yourself up on your elbows. Use your arms to help lift your chest up until you feel a gentle stretch in your abdomen and your lower back. This will place some of your body weight on your elbows. If this is uncomfortable, try stacking pillows under your chest. Your hips should stay down, against the surface that you are lying on. Keep your hip and back muscles relaxed. Hold this position for __________ seconds. Slowly relax your upper body and return to the starting position. Repeat __________ times. Complete this exercise __________ times a day. Strengthening exercises These exercises build strength and endurance in your back. Endurance is the ability to use your muscles for a long time, even after they get tired. Pelvic tilt This exercise strengthens the muscles that lie deep in the abdomen. Lie on your back on a firm surface. Bend your knees  and keep your feet flat on the surface. Tense your abdominal muscles. Tip your pelvis up toward the ceiling and flatten your lower back into the firm surface. To help with this exercise, you may place a small towel under your lower back and try to push your back into the towel. Hold this position for __________ seconds. Let your muscles relax completely before you repeat this exercise. Repeat __________ times. Complete this exercise __________ times a day. Alternating arm and leg raises  Get on your hands and knees on a firm surface. If you are on a hard floor, you may want to use padding, such as an exercise mat, to cushion your knees. Line up your arms and legs. Your hands should be directly below your shoulders, and your knees should be directly below your hips. Lift your left leg behind you. At the same time, raise your right arm and straighten it in front of you. Do not lift your leg higher than your hip. Do not lift your arm higher than your shoulder. Keep your abdominal and back muscles tight. Keep your hips facing the ground. Do not arch your back. Keep your balance carefully, and do not hold your breath. Hold this position for __________ seconds. Slowly return to the starting position. Repeat with your right leg and your left arm. Repeat __________ times. Complete this exercise __________ times a day. Posture and body mechanics Good posture and healthy body mechanics can help to relieve stress in your body's tissues and joints. Body mechanics refers to the movements and positions of your body while you do your daily activities. Posture is part of body mechanics. Good posture means: Your spine is in its natural S-curve position (neutral). Your shoulders are pulled back slightly. Your head is not tipped forward. Follow these guidelines to improve your posture and body mechanics in your everyday activities. Standing  When standing, keep your spine neutral and your feet about hip  width apart. Keep a slight bend in your knees. Your ears, shoulders, and hips should line up. When you do a task in which you stand  in one place for a long time, place one foot up on a stable object that is 2-4 inches (5-10 cm) high, such as a footstool. This helps keep your spine neutral. Sitting  When sitting, keep your spine neutral and keep your feet flat on the floor. Use a footrest, if necessary, and keep your thighs parallel to the floor. Avoid rounding your shoulders, and avoid tilting your head forward. When working at a desk or a computer, keep your desk at a height where your hands are slightly lower than your elbows. Slide your chair under your desk so you are close enough to maintain good posture. When working at a computer, place your monitor at a height where you are looking straight ahead and you do not have to tilt your head forward or downward to look at the screen. Resting  When lying down and resting, avoid positions that are most painful for you. If you have pain with activities such as sitting, bending, stooping, or squatting, lie in a position in which your body does not bend very much. For example, avoid curling up on your side with your arms and knees near your chest (fetal position). If you have pain with activities such as standing for a long time or reaching with your arms, lie with your spine in a neutral position and bend your knees slightly. Try the following positions: Lying on your side with a pillow between your knees. Lying on your back with a pillow under your knees. Lifting  When lifting objects, keep your feet at least shoulder width apart and tighten your abdominal muscles. Bend your knees and hips and keep your spine neutral. It is important to lift using the strength of your legs, not your back. Do not lock your knees straight out. Always ask for help to lift heavy or awkward objects. This information is not intended to replace advice given to you by your  health care provider. Make sure you discuss any questions you have with your health care provider. Document Revised: 01/05/2022 Document Reviewed: 01/05/2022 Elsevier Patient Education  2024 [employer].

## 2023-12-07 ENCOUNTER — Encounter: Payer: Self-pay | Admitting: Women's Health

## 2023-12-07 LAB — INTEGRATED 2
AFP MoM: 0.92
Alpha-Fetoprotein: 36.9 ng/mL
Crown Rump Length: 65.8 mm
DIA MoM: 0.71
DIA Value: 122.3 pg/mL
Estriol, Unconjugated: 0.95 ng/mL
Gest. Age on Collection Date: 12.7 wk
Gestational Age: 16.7 wk
Maternal Age at EDD: 25.2 a
Nuchal Translucency (NT): 1.9 mm
Nuchal Translucency MoM: 1.03
Number of Fetuses: 1
PAPP-A MoM: 0.74
PAPP-A Value: 906.6 ng/mL
Sonographer ID#: 309760
Test Results:: NEGATIVE
Weight: 135 [lb_av]
Weight: 135 [lb_av]
hCG MoM: 1.63
hCG Value: 53.9 [IU]/mL
uE3 MoM: 0.95

## 2023-12-26 ENCOUNTER — Encounter: Payer: Medicaid Other | Admitting: Advanced Practice Midwife

## 2023-12-26 ENCOUNTER — Other Ambulatory Visit: Payer: Medicaid Other

## 2023-12-26 ENCOUNTER — Other Ambulatory Visit: Payer: Self-pay | Admitting: Women's Health

## 2023-12-26 DIAGNOSIS — Z363 Encounter for antenatal screening for malformations: Secondary | ICD-10-CM

## 2023-12-26 DIAGNOSIS — Z3480 Encounter for supervision of other normal pregnancy, unspecified trimester: Secondary | ICD-10-CM

## 2023-12-26 DIAGNOSIS — Z3482 Encounter for supervision of other normal pregnancy, second trimester: Secondary | ICD-10-CM

## 2023-12-26 DIAGNOSIS — O34219 Maternal care for unspecified type scar from previous cesarean delivery: Secondary | ICD-10-CM

## 2023-12-26 DIAGNOSIS — Z1379 Encounter for other screening for genetic and chromosomal anomalies: Secondary | ICD-10-CM

## 2023-12-27 ENCOUNTER — Ambulatory Visit: Payer: Medicaid Other | Admitting: Obstetrics & Gynecology

## 2023-12-27 ENCOUNTER — Encounter: Payer: Self-pay | Admitting: Obstetrics & Gynecology

## 2023-12-27 ENCOUNTER — Ambulatory Visit: Payer: Medicaid Other | Admitting: Radiology

## 2023-12-27 VITALS — BP 103/69 | HR 80 | Wt 138.2 lb

## 2023-12-27 DIAGNOSIS — O34219 Maternal care for unspecified type scar from previous cesarean delivery: Secondary | ICD-10-CM | POA: Diagnosis not present

## 2023-12-27 DIAGNOSIS — Z3A19 19 weeks gestation of pregnancy: Secondary | ICD-10-CM

## 2023-12-27 DIAGNOSIS — Z363 Encounter for antenatal screening for malformations: Secondary | ICD-10-CM

## 2023-12-27 DIAGNOSIS — Z8759 Personal history of other complications of pregnancy, childbirth and the puerperium: Secondary | ICD-10-CM

## 2023-12-27 DIAGNOSIS — Z3482 Encounter for supervision of other normal pregnancy, second trimester: Secondary | ICD-10-CM

## 2023-12-27 DIAGNOSIS — Z98891 History of uterine scar from previous surgery: Secondary | ICD-10-CM | POA: Diagnosis not present

## 2023-12-27 NOTE — Progress Notes (Addendum)
 [place]:  GA = 19+3 weeks Single active female fetus, breech, FHR = 144 bpm, anterior pl, gr1,  MVP = 5.3 cm EFW 65%, AC 61% 313g,  anatomy screen completed - no apparent abn seen CL = 3.9 cm, closed - nl ov's

## 2023-12-27 NOTE — Progress Notes (Signed)
 HIGH-RISK PREGNANCY VISIT Patient name: Jasmine Pierce MRN [id number]  Date of birth: 25-Sep-2001 Chief Complaint:   Routine Prenatal Visit (Anatomy scan)  History of Present Illness:   Jasmine Pierce is a 23 y.o. [phone] female at [redacted]w[redacted]d with an Estimated Date of Delivery: 05/19/24 being seen today for ongoing management of a high-risk pregnancy complicated by:  -prior FGR -Dep/Anxiety- no meds -Prior C-section for twins, successful VBAC  Today she reports no complaints.   Contractions: Not present. Vag. Bleeding: None.  Movement: Present. denies leaking of fluid.      11/07/2023   11:02 AM 01/26/2023    3:53 PM 05/10/2022    9:52 AM 03/31/2020   10:39 AM 07/27/2019   11:49 AM  Depression screen PHQ 2/9  Decreased Interest 0 1 0 0 1  Down, Depressed, Hopeless 0 1 0 1 0  PHQ - 2 Score 0 2 0 1 1  Altered sleeping 0 1 1 0 3  Tired, decreased energy 1 2 1 1 1   Change in appetite 0 3 0 1 1  Feeling bad or failure about yourself  0 1 0 0 0  Trouble concentrating 0 2 0 0 0  Moving slowly or fidgety/restless 0 1 0 0 0  Suicidal thoughts 0 0 0 0 0  PHQ-9 Score 1 12 2 3 6   Difficult doing work/chores    Somewhat difficult      Current Outpatient Medications  Medication Instructions   Blood Pressure Monitor MISC For regular home bp monitoring during pregnancy   prenatal vitamin w/FE, FA (PRENATAL 1 + 1) 27-1 MG TABS tablet Take 1 tablet by mouth daily     Review of Systems:   Pertinent items are noted in HPI Denies abnormal vaginal discharge w/ itching/odor/irritation, headaches, visual changes, shortness of breath, chest pain, abdominal pain, severe nausea/vomiting, or problems with urination or bowel movements unless otherwise stated above. Pertinent History Reviewed:  Reviewed past medical,surgical, social, obstetrical and family history.  Reviewed problem list, medications and allergies. Physical Assessment:   Vitals:   12/27/23 1141  BP: 103/69  Pulse: 80  Weight: 138 lb  3.2 oz (62.7 kg)  Body mass index is 24.48 kg/m.           Physical Examination:   General appearance: alert, well appearing, and in no distress  Mental status: normal mood, behavior, speech, dress, motor activity, and thought processes  Skin: warm & dry   Extremities: Edema: None    Cardiovascular: normal heart rate noted  Respiratory: normal respiratory effort, no distress  Abdomen: gravid, soft, non-tender  Pelvic: Cervical exam deferred         Fetal Status:     Movement: Present    Fetal Surveillance Testing today: GA = 19+3 weeks Single active female fetus, breech, FHR = 144 bpm, anterior pl, gr1,  MVP = 5.3 cm EFW 65%, AC 61% 313g,  anatomy screen completed - no apparent abn seen CL = 3.9 cm, closed - nl ov's   Chaperone: N/A    No results found for this or any previous visit (from the past 24 hours).   Assessment & Plan:  High-risk pregnancy: [id number] at [redacted]w[redacted]d with an Estimated Date of Delivery: 05/19/24   1. [redacted] weeks gestation of pregnancy   2. Encounter for supervision of other normal pregnancy in second trimester (Primary) Normal anatomy scan  3. History of cesarean delivery - We discussed her history of c-section. Her previous c-section was due to   twin  pregnancy .  She has a history of   successful VBAC - We discussed the risks associated with repeat c-section: bleeding, infection, injury to surrounding organs/tissues I.e. bowel/bladder, development of scar tissue, wound complications such as wound separation or infection, need for additional surgery and discussed potential for future complications such as placenta accreta spectrum  nm- We discussed the risks associated with TOLAC specifically focusing on the risk of uterine rupture. We discussed with the risk of uterine rupture that while rare it is not easily predicted, that it is a surgical emergency, and it can be potentially catastrophic for mom and baby.  - After counseling, the patient was given the opportunity  to ask questions and all questions answered.  - After considering her options, she would like to [facility] - Information provided to the patient  4. H/o FGR -repeat growth @ 28wks    Meds: No orders of the defined types were placed in this encounter.   Labs/procedures today: anatomy scan- AGA  Treatment Plan:  routine OB care   Reviewed: Preterm labor symptoms and general obstetric precautions including but not limited to vaginal bleeding, contractions, leaking of fluid and fetal movement were reviewed in detail with the patient.  All questions were answered. Pt has home bp cuff. Check bp weekly, let us know if >140/90.   Follow-up: Return in about 4 weeks (around 01/24/2024) for LROB visit and 8 wk growth scan.   Future Appointments  Date Time Provider Department Center  01/24/2024  9:10 AM Cheral Marker, CNM [facility]  02/21/2024  8:30 AM [facility] [place] [facility] None  02/21/2024  9:30 AM Cheral Marker, CNM [facility]    Orders Placed This Encounter  Procedures   US OB Follow Up    Myna Hidalgo, DO Attending Obstetrician & Gynecologist, [facility] for [facility], [facility]

## 2023-12-28 ENCOUNTER — Encounter: Payer: Self-pay | Admitting: Women's Health

## 2024-01-24 ENCOUNTER — Encounter: Admitting: Women's Health

## 2024-01-27 DIAGNOSIS — E663 Overweight: Secondary | ICD-10-CM | POA: Diagnosis not present

## 2024-01-27 DIAGNOSIS — B349 Viral infection, unspecified: Secondary | ICD-10-CM | POA: Diagnosis not present

## 2024-01-27 DIAGNOSIS — Z6828 Body mass index (BMI) 28.0-28.9, adult: Secondary | ICD-10-CM | POA: Diagnosis not present

## 2024-01-31 ENCOUNTER — Ambulatory Visit: Admitting: Advanced Practice Midwife

## 2024-01-31 VITALS — BP 114/68 | HR 82 | Wt 140.8 lb

## 2024-01-31 DIAGNOSIS — Z3A24 24 weeks gestation of pregnancy: Secondary | ICD-10-CM | POA: Diagnosis not present

## 2024-01-31 DIAGNOSIS — Z3482 Encounter for supervision of other normal pregnancy, second trimester: Secondary | ICD-10-CM

## 2024-01-31 DIAGNOSIS — Z3480 Encounter for supervision of other normal pregnancy, unspecified trimester: Secondary | ICD-10-CM

## 2024-01-31 NOTE — Progress Notes (Signed)
   LOW-RISK PREGNANCY VISIT Patient name: Jasmine Pierce MRN [id number]  Date of birth: 05/29/2001 Chief Complaint:   Routine Prenatal Visit  History of Present Illness:   Jasmine Pierce is a 23 y.o. [phone] female at [redacted]w[redacted]d with an Estimated Date of Delivery: 05/19/24 being seen today for ongoing management of a low-risk pregnancy.  Today she reports no complaints. Contractions: Not present. Vag. Bleeding: None.  Movement: Present. denies leaking of fluid. Review of Systems:   Pertinent items are noted in HPI Denies abnormal vaginal discharge w/ itching/odor/irritation, headaches, visual changes, shortness of breath, chest pain, abdominal pain, severe nausea/vomiting, or problems with urination or bowel movements unless otherwise stated above. Pertinent History Reviewed:  Reviewed past medical,surgical, social, obstetrical and family history.  Reviewed problem list, medications and allergies. Physical Assessment:   Vitals:   01/31/24 1520  BP: 114/68  Pulse: 82  Weight: 140 lb 12.8 oz (63.9 kg)  Body mass index is 24.94 kg/m.        Physical Examination:   General appearance: Well appearing, and in no distress  Mental status: Alert, oriented to person, place, and time  Skin: Warm & dry  Cardiovascular: Normal heart rate noted  Respiratory: Normal respiratory effort, no distress  Abdomen: Soft, gravid, nontender  Pelvic: Cervical exam deferred         Extremities:    Fetal Status: Fetal Heart Rate (bpm): 141 Fundal Height: 24 cm Movement: Present    No results found for this or any previous visit (from the past 24 hours).  Assessment & Plan:  1) Low-risk pregnancy [id number] at [redacted]w[redacted]d with an Estimated Date of Delivery: 05/19/24   2) Hx C/S>VBAC, plans for TOLAC again (consent under media 3/18)  3) Hx FGR x 2, has EFW sched for next visit   Meds: No orders of the defined types were placed in this encounter.  Labs/procedures today: none  Plan:  Continue routine obstetrical care    Reviewed: Preterm labor symptoms and general obstetric precautions including but not limited to vaginal bleeding, contractions, leaking of fluid and fetal movement were reviewed in detail with the patient.  All questions were answered. Has home bp cuff. Check bp weekly, let us  know if >140/90.   Follow-up: Return for As scheduled (make sure also has PN2 w next visit).  No orders of the defined types were placed in this encounter.  Jolayne Natter CNM 01/31/2024 3:51 PM

## 2024-01-31 NOTE — Patient Instructions (Addendum)
 [employer], I greatly value your feedback.  If you receive a survey following your visit with us  today, we appreciate you taking the time to fill it out.  Thanks, Alvah Auerbach, CNM   You will have your sugar test next visit.  Please do not eat or drink anything after midnight the night before you come, not even water .  You will be here for at least two hours.  Please make an appointment online for the bloodwork at [email or web address] for 8:30am (or as close to this as possible). Make sure you select the [facility] service center. The day of the appointment, check in with our office first, then you will go to [facility] to start the sugar test.    [facility] HAS MOVED!!! It is now [facility] ([address] [place], [place] [address]) Entrance C, located off of [address] [employer] [address] valet parking  Go to [email or web address] to register for FREE online childbirth classes   Call the office [phone]) or go to [facility] if: You begin to have strong, frequent contractions Your water  breaks.  Sometimes it is a big gush of fluid, sometimes it is just a trickle that keeps getting your panties wet or running down your legs You have vaginal bleeding.  It is normal to have a small amount of spotting if your cervix was checked.  You don't feel your baby moving like normal.  If you don't, get you something to eat and drink and lay down and focus on feeling your baby move.   If your baby is still not moving like normal, you should call the office or go to [facility].  [place] Pediatricians/Family Doctors: [facility] [phone]           [facility] [phone]                [facility] [phone] (usually not accepting new patients unless you have family there already, you are always welcome to call and ask)      [facility] [phone]       [facility] Pediatricians/Family Doctors:  [facility]: [phone] [facility]: [phone] [facility]: [phone]  [facility] Doctors:  [facility]: [phone]  [facility]: [phone]  [facility] Doctors: [facility]: [phone]   Home Blood Pressure Monitoring for Patients   Your provider has recommended that you check your blood pressure (BP) at least once a week at home. If you do not have a blood pressure cuff at home, one will be provided for you. Contact your provider if you have not received your monitor within 1 week.   Helpful Tips for Accurate Home Blood Pressure Checks  Don't smoke, exercise, or drink caffeine 30 minutes before checking your BP Use the restroom before checking your BP (a full bladder can raise your pressure) Relax in a comfortable upright chair Feet on the ground Left arm resting comfortably on a flat surface at the level of your heart Legs uncrossed Back supported Sit quietly and don't talk Place the cuff on your bare arm Adjust snuggly, so that only two fingertips can fit between your skin and the top of the cuff Check 2 readings separated by at least one minute Keep a log of your BP readings For a visual, please reference this diagram: http://ccnc.care/bpdiagram  Provider Name: [facility]     Phone: [phone]  Zone 1: ALL CLEAR  Continue to monitor your symptoms:  BP reading is less than 140 (top number) or less than 90 (bottom number)  No right upper stomach pain No headaches or seeing spots No feeling nauseated or throwing up No swelling in face and hands  Zone 2: CAUTION Call your doctor's office for any of the following:  BP reading is greater than 140 (top number) or greater than 90 (bottom number)  Stomach pain under your ribs in the middle or right side Headaches or seeing spots Feeling nauseated or throwing up Swelling in face and hands  Zone 3: EMERGENCY  Seek  immediate medical care if you have any of the following:  BP reading is greater than160 (top number) or greater than 110 (bottom number) Severe headaches not improving with Tylenol  Serious difficulty catching your breath Any worsening symptoms from Zone 2   Second Trimester of Pregnancy The second trimester is from week 13 through week 28, months 4 through 6. The second trimester is often a time when you feel your best. Your body has also adjusted to being pregnant, and you begin to feel better physically. Usually, morning sickness has lessened or quit completely, you may have more energy, and you may have an increase in appetite. The second trimester is also a time when the fetus is growing rapidly. At the end of the sixth month, the fetus is about 9 inches long and weighs about 1 pounds. You will likely begin to feel the baby move (quickening) between 18 and 20 weeks of the pregnancy. BODY CHANGES Your body goes through many changes during pregnancy. The changes vary from woman to woman.  Your weight will continue to increase. You will notice your lower abdomen bulging out. You may begin to get stretch marks on your hips, abdomen, and breasts. You may develop headaches that can be relieved by medicines approved by your health care provider. You may urinate more often because the fetus is pressing on your bladder. You may develop or continue to have heartburn as a result of your pregnancy. You may develop constipation because certain hormones are causing the muscles that push waste through your intestines to slow down. You may develop hemorrhoids or swollen, bulging veins (varicose veins). You may have back pain because of the weight gain and pregnancy hormones relaxing your joints between the bones in your pelvis and as a result of a shift in weight and the muscles that support your balance. Your breasts will continue to grow and be tender. Your gums may bleed and may be sensitive to brushing  and flossing. Dark spots or blotches (chloasma, mask of pregnancy) may develop on your face. This will likely fade after the baby is born. A dark line from your belly button to the pubic area (linea nigra) may appear. This will likely fade after the baby is born. You may have changes in your hair. These can include thickening of your hair, rapid growth, and changes in texture. Some women also have hair loss during or after pregnancy, or hair that feels dry or thin. Your hair will most likely return to normal after your baby is born. WHAT TO EXPECT AT YOUR PRENATAL VISITS During a routine prenatal visit: You will be weighed to make sure you and the fetus are growing normally. Your blood pressure will be taken. Your abdomen will be measured to track your baby's growth. The fetal heartbeat will be listened to. Any test results from the previous visit will be discussed. Your health care provider  may ask you: How you are feeling. If you are feeling the baby move. If you have had any abnormal symptoms, such as leaking fluid, bleeding, severe headaches, or abdominal cramping. If you have any questions. Other tests that may be performed during your second trimester include: Blood tests that check for: Low iron levels (anemia). Gestational diabetes (between 24 and 28 weeks). Rh antibodies. Urine tests to check for infections, diabetes, or protein in the urine. An ultrasound to confirm the proper growth and development of the baby. An amniocentesis to check for possible genetic problems. Fetal screens for spina bifida and Down syndrome. HOME CARE INSTRUCTIONS  Avoid all smoking, herbs, alcohol, and unprescribed drugs. These chemicals affect the formation and growth of the baby. Follow your health care provider's instructions regarding medicine use. There are medicines that are either safe or unsafe to take during pregnancy. Exercise only as directed by your health care provider. Experiencing  uterine cramps is a good sign to stop exercising. Continue to eat regular, healthy meals. Wear a good support bra for breast tenderness. Do not use hot tubs, steam rooms, or saunas. Wear your seat belt at all times when driving. Avoid raw meat, uncooked cheese, cat litter boxes, and soil used by cats. These carry germs that can cause birth defects in the baby. Take your prenatal vitamins. Try taking a stool softener (if your health care provider approves) if you develop constipation. Eat more high-fiber foods, such as fresh vegetables or fruit and whole grains. Drink plenty of fluids to keep your urine clear or pale yellow. Take warm sitz baths to soothe any pain or discomfort caused by hemorrhoids. Use hemorrhoid cream if your health care provider approves. If you develop varicose veins, wear support hose. Elevate your feet for 15 minutes, 3-4 times a day. Limit salt in your diet. Avoid heavy lifting, wear low heel shoes, and practice good posture. Rest with your legs elevated if you have leg cramps or low back pain. Visit your dentist if you have not gone yet during your pregnancy. Use a soft toothbrush to brush your teeth and be gentle when you floss. A sexual relationship may be continued unless your health care provider directs you otherwise. Continue to go to all your prenatal visits as directed by your health care provider. SEEK MEDICAL CARE IF:  You have dizziness. You have mild pelvic cramps, pelvic pressure, or nagging pain in the abdominal area. You have persistent nausea, vomiting, or diarrhea. You have a bad smelling vaginal discharge. You have pain with urination. SEEK IMMEDIATE MEDICAL CARE IF:  You have a fever. You are leaking fluid from your vagina. You have spotting or bleeding from your vagina. You have severe abdominal cramping or pain. You have rapid weight gain or loss. You have shortness of breath with chest pain. You notice sudden or extreme swelling of your face,  hands, ankles, feet, or legs. You have not felt your baby move in over an hour. You have severe headaches that do not go away with medicine. You have vision changes. Document Released: 09/21/2001 Document Revised: 10/02/2013 Document Reviewed: 11/28/2012 [facility] Patient Information 2015 [place], [place]. This information is not intended to replace advice given to you by your health care provider. Make sure you discuss any questions you have with your health care provider.

## 2024-02-08 ENCOUNTER — Ambulatory Visit (INDEPENDENT_AMBULATORY_CARE_PROVIDER_SITE_OTHER): Admitting: Obstetrics & Gynecology

## 2024-02-08 VITALS — BP 121/68 | HR 82 | Wt 144.2 lb

## 2024-02-08 DIAGNOSIS — O09212 Supervision of pregnancy with history of pre-term labor, second trimester: Secondary | ICD-10-CM

## 2024-02-08 DIAGNOSIS — Z3A25 25 weeks gestation of pregnancy: Secondary | ICD-10-CM | POA: Diagnosis not present

## 2024-02-08 DIAGNOSIS — O4700 False labor before 37 completed weeks of gestation, unspecified trimester: Secondary | ICD-10-CM

## 2024-02-08 DIAGNOSIS — Z3482 Encounter for supervision of other normal pregnancy, second trimester: Secondary | ICD-10-CM

## 2024-02-08 NOTE — Progress Notes (Signed)
 HIGH-RISK PREGNANCY VISIT Patient name: Jasmine Pierce MRN [id number]  Date of birth: October 10, 2001 Chief Complaint:   Routine Prenatal Visit (W/I for cramps and back pain)  History of Present Illness:   Jasmine Pierce is a 23 y.o. [phone] female at [redacted]w[redacted]d with an Estimated Date of Delivery: 05/19/24 being seen today for ongoing management of a high-risk pregnancy complicated by:  -h/o preterm labor -prior C-section -prior FGR  Lower tightness and back pain- on/off since yesterday.  Pain is typically every once in a while.  Today, it happened the whole car ride home.  She is not sure how frequent- someitmes every 2 minutes.   Rates her pain 5/10.  Trying to stay hydrating notes at least 3-4 cups of water  throughout the day.   Vag. Bleeding: None.  Movement: Present. denies leaking of fluid.      11/07/2023   11:02 AM 01/26/2023    3:53 PM 05/10/2022    9:52 AM 03/31/2020   10:39 AM 07/27/2019   11:49 AM  Depression screen PHQ 2/9  Decreased Interest 0 1 0 0 1  Down, Depressed, Hopeless 0 1 0 1 0  PHQ - 2 Score 0 2 0 1 1  Altered sleeping 0 1 1 0 3  Tired, decreased energy 1 2 1 1 1   Change in appetite 0 3 0 1 1  Feeling bad or failure about yourself  0 1 0 0 0  Trouble concentrating 0 2 0 0 0  Moving slowly or fidgety/restless 0 1 0 0 0  Suicidal thoughts 0 0 0 0 0  PHQ-9 Score 1 12 2 3 6   Difficult doing work/chores    Somewhat difficult      Current Outpatient Medications  Medication Instructions   Blood Pressure Monitor MISC For regular home bp monitoring during pregnancy   prenatal vitamin w/FE, FA (PRENATAL 1 + 1) 27-1 MG TABS tablet Take 1 tablet by mouth daily     Review of Systems:   Pertinent items are noted in HPI Denies abnormal vaginal discharge w/ itching/odor/irritation, headaches, visual changes, shortness of breath, chest pain, abdominal pain, severe nausea/vomiting, or problems with urination or bowel movements unless otherwise stated above. Pertinent History  Reviewed:  Reviewed past medical,surgical, social, obstetrical and family history.  Reviewed problem list, medications and allergies. Physical Assessment:   Vitals:   02/08/24 1557  BP: 121/68  Pulse: 82  Weight: 144 lb 3.2 oz (65.4 kg)  Body mass index is 25.54 kg/m.           Physical Examination:   General appearance: alert, well appearing, and in no distress  Mental status: normal mood, behavior, speech, dress, motor activity, and thought processes  Skin: warm & dry   Extremities:   no edema no calf tenderness   Cardiovascular: normal heart rate noted  Respiratory: normal respiratory effort, no distress  Abdomen: gravid, soft, non-tender  GU: Normal external genitalia, vaginal pink mucosa with no abnormal discharge.  On sterile speculum exam cervix visualized does not appear dilated.  Fetal fibronectin obtained.  On bimanual exam cervix- internal os is closed  Fetal Status:     Movement: Present    Fetal Surveillance Testing today: TOCO completed- contractions noted q 3-18min   Chaperone: Lorean Rodes    No results found for this or any previous visit (from the past 24 hours).   Assessment & Plan:  High-risk pregnancy: [id number] at [redacted]w[redacted]d with an Estimated Date of Delivery: 05/19/24   1) Preterm contractions -  While contractions were noted on toco, suspect possible Glover Larve as no cervical change appreciated - FFN obtained - Advised hydration and rest, if contractions and tightening continue advised that patient go to MAU for further evaluation and management - Will also complete UA and culture to rule out UTI - No evidence of vaginitis on initial exam   Meds: No orders of the defined types were placed in this encounter.   Labs/procedures today: UA and culture  Treatment Plan:  routine OB care and as outlined above  Reviewed: Preterm labor symptoms and general obstetric precautions including but not limited to vaginal bleeding, contractions, leaking of fluid and  fetal movement were reviewed in detail with the patient.  All questions were answered.   Follow-up: Return for as scheduled.   Future Appointments  Date Time Provider Department Center  02/21/2024  8:30 AM [facility] LAB [facility]  02/21/2024  8:30 AM [facility] US  [facility] None  02/21/2024  9:30 AM Ferd Householder, CNM [facility]    Orders Placed This Encounter  Procedures   Urine Culture   Fetal fibronectin   Urinalysis, Routine w reflex microscopic    Keene Pastures, DO Attending Obstetrician & Gynecologist, [facility] for [employer], [facility]

## 2024-02-09 ENCOUNTER — Encounter: Payer: Self-pay | Admitting: Obstetrics & Gynecology

## 2024-02-09 LAB — URINALYSIS, ROUTINE W REFLEX MICROSCOPIC
Bilirubin, UA: NEGATIVE
Glucose, UA: NEGATIVE
Ketones, UA: NEGATIVE
Leukocytes,UA: NEGATIVE
Nitrite, UA: NEGATIVE
Protein,UA: NEGATIVE
RBC, UA: NEGATIVE
Specific Gravity, UA: 1.006 (ref 1.005–1.030)
Urobilinogen, Ur: 0.2 mg/dL (ref 0.2–1.0)
pH, UA: 7 (ref 5.0–7.5)

## 2024-02-09 LAB — FETAL FIBRONECTIN: Fetal Fibronectin: NEGATIVE

## 2024-02-10 LAB — URINE CULTURE

## 2024-02-15 ENCOUNTER — Encounter: Payer: Self-pay | Admitting: Women's Health

## 2024-02-16 ENCOUNTER — Other Ambulatory Visit: Payer: Self-pay | Admitting: Women's Health

## 2024-02-16 ENCOUNTER — Telehealth: Payer: Self-pay | Admitting: *Deleted

## 2024-02-16 DIAGNOSIS — M7989 Other specified soft tissue disorders: Secondary | ICD-10-CM

## 2024-02-16 DIAGNOSIS — M79662 Pain in left lower leg: Secondary | ICD-10-CM

## 2024-02-16 DIAGNOSIS — Z3A26 26 weeks gestation of pregnancy: Secondary | ICD-10-CM

## 2024-02-16 NOTE — Telephone Encounter (Signed)
 Called to get STAT US  for left leg however, Central Scheduling stating no one available at [facility] to do it and [facility]  Notified patient and advised to go to [facility] for eval to r/o DVT. Pt verbalized understanding and agreeable to go.

## 2024-02-16 NOTE — Progress Notes (Signed)
 v

## 2024-02-17 ENCOUNTER — Inpatient Hospital Stay (HOSPITAL_COMMUNITY)
Admission: AD | Admit: 2024-02-17 | Discharge: 2024-02-17 | Disposition: A | Discharge status: Home / Self Care | Attending: Obstetrics & Gynecology | Admitting: Obstetrics & Gynecology

## 2024-02-17 ENCOUNTER — Inpatient Hospital Stay (HOSPITAL_COMMUNITY)

## 2024-02-17 ENCOUNTER — Other Ambulatory Visit: Payer: Self-pay

## 2024-02-17 ENCOUNTER — Encounter (HOSPITAL_COMMUNITY): Payer: Self-pay | Admitting: Obstetrics and Gynecology

## 2024-02-17 DIAGNOSIS — M79605 Pain in left leg: Secondary | ICD-10-CM

## 2024-02-17 DIAGNOSIS — M79662 Pain in left lower leg: Secondary | ICD-10-CM | POA: Diagnosis not present

## 2024-02-17 DIAGNOSIS — Z3A26 26 weeks gestation of pregnancy: Secondary | ICD-10-CM | POA: Diagnosis not present

## 2024-02-17 DIAGNOSIS — O26892 Other specified pregnancy related conditions, second trimester: Secondary | ICD-10-CM

## 2024-02-17 NOTE — MAU Note (Signed)
 MAU Triage Note:  .Jasmine Pierce is a 23 y.o. at [redacted]w[redacted]d here in MAU reporting: sent from [facility] office for evaluation and US  of left leg. Reports redness and pain starting in her left calf three days ago. Today she started having an increase in pain and it moved to the side of her leg as well. Denies swelling or warmth of the extremities. Denies VB or LOF. Report +FM.   Patient complaint: swelling and pain in leg, sent for ultrasound  Pain Score: 3  Pain Location: Leg    Onset of complaint: 3 days ago LMP: Patient's last menstrual period was 08/13/2023.  FHT:   Bypass triage/pt taken directly to room  Lab orders placed from triage: N/A

## 2024-02-17 NOTE — Progress Notes (Signed)
 LLE venous duplex has been completed.  Preliminary results given to Dr. Cooper Denver.   Results can be found under chart review under CV PROC. 02/17/2024 5:42 PM Karlos Scadden RVT, RDMS

## 2024-02-17 NOTE — MAU Note (Signed)
 Vascular at bedside for doppler of left leg.

## 2024-02-17 NOTE — Discharge Instructions (Addendum)
 Jasmine Pierce

## 2024-02-17 NOTE — MAU Provider Note (Signed)
 Chief Complaint: Leg Pain  SUBJECTIVE HPI: Jasmine Pierce is a 23 y.o. [phone] at [redacted]w[redacted]d by LMP who presents to maternity admissions reporting left lower leg pain since Tuesday with worsening severity. No signs/symptoms of infection. No rashes. No trauma or breaks in the skin. No palpitations. No SOB.   She denies vaginal bleeding, vaginal itching/burning, urinary symptoms, pelvic pain/pressure, contractions, h/a, dizziness, n/v, or fever/chills.  Baby moving well.   HPI  Past Medical History:  Diagnosis Date   Asthma    as a child   Miscarriage    Ovarian cyst    ruptured in 2016   Past Surgical History:  Procedure Laterality Date   CESAREAN SECTION MULTI-GESTATIONAL N/A 01/09/2020   Procedure: CESAREAN SECTION MULTI-GESTATIONAL;  Surgeon: Wendelyn Halter, MD;  Location: [facility];  Service: Obstetrics;  Laterality: N/A;   NO PAST SURGERIES     WISDOM TOOTH EXTRACTION     Social History   Socioeconomic History   Marital status: Married    Spouse name: Not on file   Number of children: 2   Years of education: Not on file   Highest education level: High school graduate  Occupational History   Occupation: [employer]  Tobacco Use   Smoking status: Former    Types: Cigarettes   Smokeless tobacco: Never   Tobacco comments:    Quit "months ago"   Advertising account planner   Vaping status: Former  Substance and Sexual Activity   Alcohol use: No   Drug use: No   Sexual activity: Yes    Birth control/protection: None  Other Topics Concern   Not on file  Social History Narrative   Not on file   Social Drivers of Health   Financial Resource Strain: Low Risk  (11/07/2023)   Overall Financial Resource Strain (CARDIA)    Difficulty of Paying Living Expenses: Not hard at all  Food Insecurity: No Food Insecurity (11/07/2023)   Hunger Vital Sign    Worried About Running Out of Food in the Last Year: Never true    Ran Out of Food in the Last Year: Never true  Transportation Needs:  No Transportation Needs (11/07/2023)   PRAPARE - Administrator, Civil Service (Medical): No    Lack of Transportation (Non-Medical): No  Physical Activity: Sufficiently Active (11/07/2023)   Exercise Vital Sign    Days of Exercise per Week: 7 days    Minutes of Exercise per Session: 50 min  Stress: No Stress Concern Present (11/07/2023)   [employer] of Occupational Health - Occupational Stress Questionnaire    Feeling of Stress : Not at all  Social Connections: Socially Isolated (01/26/2023)   Social Connection and Isolation Panel [NHANES]    Frequency of Communication with Friends and Family: Twice a week    Frequency of Social Gatherings with Friends and Family: Never    Attends Religious Services: Never    Database administrator or Organizations: No    Attends Banker Meetings: Never    Marital Status: Married  Catering manager Violence: Not At Risk (11/07/2023)   Humiliation, Afraid, Rape, and Kick questionnaire    Fear of Current or Ex-Partner: No    Emotionally Abused: No    Physically Abused: No    Sexually Abused: No   No current facility-administered medications on file prior to encounter.   Current Outpatient Medications on File Prior to Encounter  Medication Sig Dispense Refill   prenatal vitamin w/FE, FA (  PRENATAL 1 + 1) 27-1 MG TABS tablet Take 1 tablet by mouth daily 90 tablet 3   Blood Pressure Monitor MISC For regular home bp monitoring during pregnancy 1 each 0   No Known Allergies  ROS:  Pertinent positives/negatives listed above.  I have reviewed patient's Past Medical Hx, Surgical Hx, Family Hx, Social Hx, medications and allergies.   Physical Exam  Patient Vitals for the past 24 hrs:  BP Temp Temp src Pulse Resp SpO2 Height Weight  02/17/24 1720 105/64 -- -- 87 -- -- -- --  02/17/24 1648 113/66 98.5 F (36.9 C) Oral (!) 108 18 99 % 5\' 3"  (1.6 m) 64.4 kg   Constitutional: Well-developed, well-nourished female in no acute  distress.  Cardiovascular: normal rate Respiratory: normal effort GI: Abd soft, non-tender. Pos BS x 4, Gravid MS: Extremities mildly tender left posterior medial calf 33 cm, non-tender right lower extremity, 34 cm right calf, no edema, normal ROM Neurologic: Alert and oriented x 4. LE sensation and strength intact.  GU: Neg CVAT.  EFM: baseline 150, accels, no decels, moderate variability TOCO: no contractions  LAB RESULTS No results found for this or any previous visit (from the past 24 hours).  A/Positive/-- (01/27 1230)  IMAGING VAS US  LOWER EXTREMITY VENOUS (DVT) (ONLY [facility]) Result Date: 02/17/2024  Lower Venous DVT Study Patient Name:  Jasmine Pierce  Date of Exam:   02/17/2024 Medical Rec #: [id number]      Accession #:    [id number] Date of Birth: 2000/11/23       Patient Gender: F Patient Age:   34 years Exam Location:  [facility] Procedure:      VAS US  LOWER EXTREMITY VENOUS (DVT) Referring Phys: Jenille Laszlo --------------------------------------------------------------------------------  Indications: Calf pain.  Risk Factors: Currently pregnant. Comparison Study: No previous exams Performing Technologist: Jody Hill RVT, RDMS  Examination Guidelines: A complete evaluation includes B-mode imaging, spectral Doppler, color Doppler, and power Doppler as needed of all accessible portions of each vessel. Bilateral testing is considered an integral part of a complete examination. Limited examinations for reoccurring indications may be performed as noted. The reflux portion of the exam is performed with the patient in reverse Trendelenburg.  +-----+---------------+---------+-----------+----------+--------------+ RIGHTCompressibilityPhasicitySpontaneityPropertiesThrombus Aging +-----+---------------+---------+-----------+----------+--------------+ CFV  Full           Yes      Yes                                  +-----+---------------+---------+-----------+----------+--------------+   +---------+---------------+---------+-----------+----------+--------------+ LEFT     CompressibilityPhasicitySpontaneityPropertiesThrombus Aging +---------+---------------+---------+-----------+----------+--------------+ CFV      Full           Yes      Yes                                 +---------+---------------+---------+-----------+----------+--------------+ SFJ      Full                                                        +---------+---------------+---------+-----------+----------+--------------+ FV Prox  Full           Yes      Yes                                 +---------+---------------+---------+-----------+----------+--------------+  FV Mid   Full           Yes      Yes                                 +---------+---------------+---------+-----------+----------+--------------+ FV DistalFull           Yes      Yes                                 +---------+---------------+---------+-----------+----------+--------------+ PFV      Full                                                        +---------+---------------+---------+-----------+----------+--------------+ POP      Full           Yes      Yes                                 +---------+---------------+---------+-----------+----------+--------------+ PTV      Full                                                        +---------+---------------+---------+-----------+----------+--------------+ PERO     Full                                                        +---------+---------------+---------+-----------+----------+--------------+     Summary: RIGHT: - No evidence of common femoral vein obstruction.   LEFT: - There is no evidence of deep vein thrombosis in the lower extremity.  - No cystic structure found in the popliteal fossa.  *See table(s) above for measurements and observations.    Preliminary      MAU Management/MDM: Orders Placed This Encounter  Procedures   VAS US  LOWER EXTREMITY VENOUS (DVT) (ONLY [facility])    No orders of the defined types were placed in this encounter.   ASSESSMENT 1. [redacted] weeks gestation of pregnancy   2. Acute pain of left lower extremity   Suspect muscle cramps given negative LE doppler - Trial Magnesium glycinate 400 mg daily, calf stretches and compression stockings  PLAN Discharge home with strict return precautions. Allergies as of 02/17/2024   No Known Allergies      Medication List     TAKE these medications    Blood Pressure Monitor Misc For regular home bp monitoring during pregnancy   prenatal vitamin w/FE, FA 27-1 MG Tabs tablet Take 1 tablet by mouth daily        Waylynn Benefiel, MD FMOB Fellow, Faculty practice [facility], Center for [facility]  02/17/2024  6:07 PM

## 2024-02-21 ENCOUNTER — Encounter: Admitting: Women's Health

## 2024-02-21 ENCOUNTER — Other Ambulatory Visit

## 2024-02-21 ENCOUNTER — Encounter: Payer: Self-pay | Admitting: Women's Health

## 2024-02-21 ENCOUNTER — Ambulatory Visit

## 2024-02-21 ENCOUNTER — Ambulatory Visit: Admitting: Women's Health

## 2024-02-21 DIAGNOSIS — Z3480 Encounter for supervision of other normal pregnancy, unspecified trimester: Secondary | ICD-10-CM

## 2024-02-21 DIAGNOSIS — Z131 Encounter for screening for diabetes mellitus: Secondary | ICD-10-CM | POA: Diagnosis not present

## 2024-02-21 DIAGNOSIS — Z3A27 27 weeks gestation of pregnancy: Secondary | ICD-10-CM | POA: Diagnosis not present

## 2024-02-21 DIAGNOSIS — Z3482 Encounter for supervision of other normal pregnancy, second trimester: Secondary | ICD-10-CM

## 2024-02-21 NOTE — Patient Instructions (Signed)
 Yazmyn, thank you for choosing our office today! We appreciate the opportunity to meet your healthcare needs. You may receive a short survey by mail, e-mail, or through [employer]. If you are happy with your care we would appreciate if you could take just a few minutes to complete the survey questions. We read all of your comments and take your feedback very seriously. Thank you again for choosing our office.  Center for [employer] Team at [facility]  [facility] at [facility] ([address] [place], [place] [address]) Entrance C, located off of [address] Free 24/7 valet parking   CLASSES: Go to [email or web address] to register for classes (childbirth, breastfeeding, waterbirth, infant CPR, daddy bootcamp, etc.)  Call the office [phone]) or go to [facility] if: You begin to have strong, frequent contractions Your water  breaks.  Sometimes it is a big gush of fluid, sometimes it is just a trickle that keeps getting your panties wet or running down your legs You have vaginal bleeding.  It is normal to have a small amount of spotting if your cervix was checked.  You don't feel your baby moving like normal.  If you don't, get you something to eat and drink and lay down and focus on feeling your baby move.   If your baby is still not moving like normal, you should call the office or go to [facility].  Call the office [phone]) or go to [facility] hospital for these signs of pre-eclampsia: Severe headache that does not go away with Tylenol  Visual changes- seeing spots, double, blurred vision Pain under your right breast or upper abdomen that does not go away with Tums or heartburn medicine Nausea and/or vomiting Severe swelling in your hands, feet, and face   Tdap Vaccine It is recommended that you get the Tdap vaccine during the third trimester of EACH pregnancy to help protect your baby from getting pertussis (whooping cough) 27-36 weeks is the BEST time to do  this so that you can pass the protection on to your baby. During pregnancy is better than after pregnancy, but if you are unable to get it during pregnancy it will be offered at the hospital.  You can get this vaccine with us , at the health department, your family doctor, or some local pharmacies Everyone who will be around your baby should also be up-to-date on their vaccines before the baby comes. Adults (who are not pregnant) only need 1 dose of Tdap during adulthood.   [facility] Pediatricians/Family Doctors [facility]): [address] Meg Spina, [phone]           [facility]: [address], [phone]                [facility]): [address], [phone] (call to ask if accepting patients) [facility]: [address], [place], [phone]    [facility] Pediatricians/Family Doctors [facility]): [phone], [phone] [facility]: [address] [place], [phone] [facility]: [address], [phone]  [facility] Doctors  [facility]): [phone] [facility]: [address] [phone]   [facility] Doctors [facility]: [address], [phone]  [facility] Family Doctors  [facility]: [phone], [phone]  Home Blood Pressure Monitoring for Patients   Your provider has recommended that you check your  blood pressure (BP) at least once a week at home. If you do not have a blood pressure cuff at home, one will be provided for you. Contact your provider if you have not received your monitor within 1 week.   Helpful Tips for Accurate Home Blood Pressure Checks  Don't smoke, exercise, or drink caffeine 30 minutes before checking your BP Use the restroom before checking your BP (a full bladder can raise your  pressure) Relax in a comfortable upright chair Feet on the ground Left arm resting comfortably on a flat surface at the level of your heart Legs uncrossed Back supported Sit quietly and don't talk Place the cuff on your bare arm Adjust snuggly, so that only two fingertips can fit between your skin and the top of the cuff Check 2 readings separated by at least one minute Keep a log of your BP readings For a visual, please reference this diagram: http://ccnc.care/bpdiagram  Provider Name: [facility]     Phone: [phone]  Zone 1: ALL CLEAR  Continue to monitor your symptoms:  BP reading is less than 140 (top number) or less than 90 (bottom number)  No right upper stomach pain No headaches or seeing spots No feeling nauseated or throwing up No swelling in face and hands  Zone 2: CAUTION Call your doctor's office for any of the following:  BP reading is greater than 140 (top number) or greater than 90 (bottom number)  Stomach pain under your ribs in the middle or right side Headaches or seeing spots Feeling nauseated or throwing up Swelling in face and hands  Zone 3: EMERGENCY  Seek immediate medical care if you have any of the following:  BP reading is greater than160 (top number) or greater than 110 (bottom number) Severe headaches not improving with Tylenol  Serious difficulty catching your breath Any worsening symptoms from Zone 2   Third Trimester of Pregnancy The third trimester is from week 29 through week 42, months 7 through 9. The third trimester is a time when the fetus is growing rapidly. At the end of the ninth month, the fetus is about 20 inches in length and weighs 6-10 pounds.  BODY CHANGES Your body goes through many changes during pregnancy. The changes vary from woman to woman.  Your weight will continue to increase. You can expect to gain 25-35 pounds (11-16 kg) by the end of the pregnancy. You may begin to get stretch marks on your hips, abdomen,  and breasts. You may urinate more often because the fetus is moving lower into your pelvis and pressing on your bladder. You may develop or continue to have heartburn as a result of your pregnancy. You may develop constipation because certain hormones are causing the muscles that push waste through your intestines to slow down. You may develop hemorrhoids or swollen, bulging veins (varicose veins). You may have pelvic pain because of the weight gain and pregnancy hormones relaxing your joints between the bones in your pelvis. Backaches may result from overexertion of the muscles supporting your posture. You may have changes in your hair. These can include thickening of your hair, rapid growth, and changes in texture. Some women also have hair loss during or after pregnancy, or hair that feels dry or thin. Your hair will most likely return to normal after your baby is born. Your breasts will continue to grow and be tender. A yellow discharge may leak from your breasts called colostrum. Your belly button may stick out. You may  feel short of breath because of your expanding uterus. You may notice the fetus "dropping," or moving lower in your abdomen. You may have a bloody mucus discharge. This usually occurs a few days to a week before labor begins. Your cervix becomes thin and soft (effaced) near your due date. WHAT TO EXPECT AT YOUR PRENATAL EXAMS  You will have prenatal exams every 2 weeks until week 36. Then, you will have weekly prenatal exams. During a routine prenatal visit: You will be weighed to make sure you and the fetus are growing normally. Your blood pressure is taken. Your abdomen will be measured to track your baby's growth. The fetal heartbeat will be listened to. Any test results from the previous visit will be discussed. You may have a cervical check near your due date to see if you have effaced. At around 36 weeks, your caregiver will check your cervix. At the same time, your  caregiver will also perform a test on the secretions of the vaginal tissue. This test is to determine if a type of bacteria, Group B streptococcus, is present. Your caregiver will explain this further. Your caregiver may ask you: What your birth plan is. How you are feeling. If you are feeling the baby move. If you have had any abnormal symptoms, such as leaking fluid, bleeding, severe headaches, or abdominal cramping. If you have any questions. Other tests or screenings that may be performed during your third trimester include: Blood tests that check for low iron levels (anemia). Fetal testing to check the health, activity level, and growth of the fetus. Testing is done if you have certain medical conditions or if there are problems during the pregnancy. FALSE LABOR You may feel small, irregular contractions that eventually go away. These are called Braxton Hicks contractions, or false labor. Contractions may last for hours, days, or even weeks before true labor sets in. If contractions come at regular intervals, intensify, or become painful, it is best to be seen by your caregiver.  SIGNS OF LABOR  Menstrual-like cramps. Contractions that are 5 minutes apart or less. Contractions that start on the top of the uterus and spread down to the lower abdomen and back. A sense of increased pelvic pressure or back pain. A watery or bloody mucus discharge that comes from the vagina. If you have any of these signs before the 37th week of pregnancy, call your caregiver right away. You need to go to the hospital to get checked immediately. HOME CARE INSTRUCTIONS  Avoid all smoking, herbs, alcohol, and unprescribed drugs. These chemicals affect the formation and growth of the baby. Follow your caregiver's instructions regarding medicine use. There are medicines that are either safe or unsafe to take during pregnancy. Exercise only as directed by your caregiver. Experiencing uterine cramps is a good sign to  stop exercising. Continue to eat regular, healthy meals. Wear a good support bra for breast tenderness. Do not use hot tubs, steam rooms, or saunas. Wear your seat belt at all times when driving. Avoid raw meat, uncooked cheese, cat litter boxes, and soil used by cats. These carry germs that can cause birth defects in the baby. Take your prenatal vitamins. Try taking a stool softener (if your caregiver approves) if you develop constipation. Eat more high-fiber foods, such as fresh vegetables or fruit and whole grains. Drink plenty of fluids to keep your urine clear or pale yellow. Take warm sitz baths to soothe any pain or discomfort caused by hemorrhoids. Use hemorrhoid cream if  your caregiver approves. If you develop varicose veins, wear support hose. Elevate your feet for 15 minutes, 3-4 times a day. Limit salt in your diet. Avoid heavy lifting, wear low heal shoes, and practice good posture. Rest a lot with your legs elevated if you have leg cramps or low back pain. Visit your dentist if you have not gone during your pregnancy. Use a soft toothbrush to brush your teeth and be gentle when you floss. A sexual relationship may be continued unless your caregiver directs you otherwise. Do not travel far distances unless it is absolutely necessary and only with the approval of your caregiver. Take prenatal classes to understand, practice, and ask questions about the labor and delivery. Make a trial run to the hospital. Pack your hospital bag. Prepare the baby's nursery. Continue to go to all your prenatal visits as directed by your caregiver. SEEK MEDICAL CARE IF: You are unsure if you are in labor or if your water  has broken. You have dizziness. You have mild pelvic cramps, pelvic pressure, or nagging pain in your abdominal area. You have persistent nausea, vomiting, or diarrhea. You have a bad smelling vaginal discharge. You have pain with urination. SEEK IMMEDIATE MEDICAL CARE IF:  You  have a fever. You are leaking fluid from your vagina. You have spotting or bleeding from your vagina. You have severe abdominal cramping or pain. You have rapid weight loss or gain. You have shortness of breath with chest pain. You notice sudden or extreme swelling of your face, hands, ankles, feet, or legs. You have not felt your baby move in over an hour. You have severe headaches that do not go away with medicine. You have vision changes. Document Released: 09/21/2001 Document Revised: 10/02/2013 Document Reviewed: 11/28/2012 [facility] Patient Information 2015 [place], [place]. This information is not intended to replace advice given to you by your health care provider. Make sure you discuss any questions you have with your health care provider.

## 2024-02-21 NOTE — Progress Notes (Signed)
 TELEHEALTH VIRTUAL OBSTETRICS VISIT ENCOUNTER NOTE Patient name: Jasmine Pierce MRN [id number]  Date of birth: [birth year]  I connected with patient on 02/21/24 at 10:10 AM EDT by telephone and verified that I am speaking with the correct person using two identifiers. Pt is not currently in our office, she is at the lab.  The provider is in the office.    I discussed the limitations, risks, security and privacy concerns of performing an evaluation and management service by telephone and the availability of in person appointments. I also discussed with the patient that there may be a patient responsible charge related to this service. The patient expressed understanding and agreed to proceed.  Chief Complaint:   No chief complaint on file.  History of Present Illness:   Jasmine Pierce is a 23 y.o. [phone] female at [redacted]w[redacted]d with an Estimated Date of Delivery: 05/19/24 being evaluated today for ongoing management of a low-risk pregnancy.     11/07/2023   11:02 AM 01/26/2023    3:53 PM 05/10/2022    9:52 AM 03/31/2020   10:39 AM 07/27/2019   11:49 AM  Depression screen PHQ 2/9  Decreased Interest 0 1 0 0 1  Down, Depressed, Hopeless 0 1 0 1 0  PHQ - 2 Score 0 2 0 1 1  Altered sleeping 0 1 1 0 3  Tired, decreased energy 1 2 1 1 1   Change in appetite 0 3 0 1 1  Feeling bad or failure about yourself  0 1 0 0 0  Trouble concentrating 0 2 0 0 0  Moving slowly or fidgety/restless 0 1 0 0 0  Suicidal thoughts 0 0 0 0 0  PHQ-9 Score 1 12 2 3 6   Difficult doing work/chores    Somewhat difficult     Today she reports no complaints.  .  .   . denies leaking of fluid. Review of Systems:   Pertinent items are noted in HPI Denies abnormal vaginal discharge w/ itching/odor/irritation, headaches, visual changes, shortness of breath, chest pain, abdominal pain, severe nausea/vomiting, or problems with urination or bowel movements unless otherwise stated above. Pertinent History Reviewed:  Reviewed past  medical,surgical, social, obstetrical and family history.  Reviewed problem list, medications and allergies. Physical Assessment:  There were no vitals filed for this visit.There is no height or weight on file to calculate BMI. Will check bp when she gets home and send message        Physical Examination:   General:  Alert, oriented and cooperative.   Mental Status: Normal mood and affect perceived. Normal judgment and thought content.  Rest of physical exam deferred due to type of encounter  No results found for this or any previous visit (from the past 24 hours).  Assessment & Plan:  1) Pregnancy [id number] at [redacted]w[redacted]d with an Estimated Date of Delivery: 05/19/24   2) Prev c/s then VBAC, wants TOLAC  3) H/O FGR x 2> EFW u/s scheduled for today but office closed to pts d/t water  break, will reschedule, last EFW 65% at 19wks   Meds: No orders of the defined types were placed in this encounter.   Labs/procedures today: pn2  Plan:  Continue routine obstetrical care.  Does have home bp cuff. Office bp cuff given: not applicable. Check bp weekly, let us  know if consistently >140 and/or >90.  Next visit: prefers in person    Reviewed: Preterm labor symptoms and general obstetric precautions including but not limited to vaginal bleeding, contractions,  leaking of fluid and fetal movement were reviewed in detail with the patient. The patient was advised to call back or seek an in-person office evaluation/go to [facility] at [facility]: [facility] for any urgent or concerning symptoms. All questions were answered. Please refer to After Visit Summary for other counseling recommendations.    I provided 10 minutes of non-face-to-face time during this encounter.  Follow-up: Return for reschedule efw u/s to 1st available, LROB in 3wks.  No orders of the defined types were placed in this encounter.  Ferd Householder CNM, [facility] 02/21/2024 10:28 AM

## 2024-02-22 ENCOUNTER — Ambulatory Visit: Payer: Self-pay | Admitting: Women's Health

## 2024-02-23 ENCOUNTER — Ambulatory Visit

## 2024-02-23 DIAGNOSIS — Z3A27 27 weeks gestation of pregnancy: Secondary | ICD-10-CM | POA: Diagnosis not present

## 2024-02-23 DIAGNOSIS — Z3480 Encounter for supervision of other normal pregnancy, unspecified trimester: Secondary | ICD-10-CM

## 2024-02-23 DIAGNOSIS — Z8759 Personal history of other complications of pregnancy, childbirth and the puerperium: Secondary | ICD-10-CM | POA: Diagnosis not present

## 2024-02-23 DIAGNOSIS — Z3482 Encounter for supervision of other normal pregnancy, second trimester: Secondary | ICD-10-CM

## 2024-02-23 LAB — CBC
Hematocrit: 42.4 % (ref 34.0–46.6)
Hemoglobin: 14 g/dL (ref 11.1–15.9)
MCH: 30.3 pg (ref 26.6–33.0)
MCHC: 33 g/dL (ref 31.5–35.7)
MCV: 92 fL (ref 79–97)
Platelets: 210 10*3/uL (ref 150–450)
RBC: 4.62 x10E6/uL (ref 3.77–5.28)
RDW: 12.8 % (ref 11.7–15.4)
WBC: 7.5 10*3/uL (ref 3.4–10.8)

## 2024-02-23 LAB — GLUCOSE TOLERANCE, 2 HOURS W/ 1HR
Glucose, 1 hour: 87 mg/dL (ref 70–179)
Glucose, 2 hour: 87 mg/dL (ref 70–152)
Glucose, Fasting: 68 mg/dL — ABNORMAL LOW (ref 70–91)

## 2024-02-23 LAB — RPR: RPR Ser Ql: NONREACTIVE

## 2024-02-23 LAB — ANTIBODY SCREEN: Antibody Screen: NEGATIVE

## 2024-02-23 LAB — HIV ANTIBODY (ROUTINE TESTING W REFLEX)

## 2024-02-23 NOTE — Progress Notes (Signed)
 US  27+5 wks,cephalic,cx 3.6 cm,anterior placenta gr 0,AFI 14 cm,FHR 142 bpm,EFW 1152 g 47%

## 2024-02-25 ENCOUNTER — Encounter: Payer: Self-pay | Admitting: Women's Health

## 2024-03-14 ENCOUNTER — Ambulatory Visit: Admitting: Advanced Practice Midwife

## 2024-03-14 VITALS — BP 107/70 | HR 80 | Wt 147.0 lb

## 2024-03-14 DIAGNOSIS — Z23 Encounter for immunization: Secondary | ICD-10-CM

## 2024-03-14 DIAGNOSIS — Z3A30 30 weeks gestation of pregnancy: Secondary | ICD-10-CM | POA: Diagnosis not present

## 2024-03-14 DIAGNOSIS — Z87898 Personal history of other specified conditions: Secondary | ICD-10-CM | POA: Insufficient documentation

## 2024-03-14 DIAGNOSIS — Z3480 Encounter for supervision of other normal pregnancy, unspecified trimester: Secondary | ICD-10-CM

## 2024-03-14 DIAGNOSIS — Z3483 Encounter for supervision of other normal pregnancy, third trimester: Secondary | ICD-10-CM

## 2024-03-14 NOTE — Patient Instructions (Signed)
 Yazmyn, thank you for choosing our office today! We appreciate the opportunity to meet your healthcare needs. You may receive a short survey by mail, e-mail, or through [employer]. If you are happy with your care we would appreciate if you could take just a few minutes to complete the survey questions. We read all of your comments and take your feedback very seriously. Thank you again for choosing our office.  Center for [employer] Team at [facility]  [facility] at [facility] ([address] [place], [place] [address]) Entrance C, located off of [address] Free 24/7 valet parking   CLASSES: Go to [email or web address] to register for classes (childbirth, breastfeeding, waterbirth, infant CPR, daddy bootcamp, etc.)  Call the office [phone]) or go to [facility] if: You begin to have strong, frequent contractions Your water  breaks.  Sometimes it is a big gush of fluid, sometimes it is just a trickle that keeps getting your panties wet or running down your legs You have vaginal bleeding.  It is normal to have a small amount of spotting if your cervix was checked.  You don't feel your baby moving like normal.  If you don't, get you something to eat and drink and lay down and focus on feeling your baby move.   If your baby is still not moving like normal, you should call the office or go to [facility].  Call the office [phone]) or go to [facility] hospital for these signs of pre-eclampsia: Severe headache that does not go away with Tylenol  Visual changes- seeing spots, double, blurred vision Pain under your right breast or upper abdomen that does not go away with Tums or heartburn medicine Nausea and/or vomiting Severe swelling in your hands, feet, and face   Tdap Vaccine It is recommended that you get the Tdap vaccine during the third trimester of EACH pregnancy to help protect your baby from getting pertussis (whooping cough) 27-36 weeks is the BEST time to do  this so that you can pass the protection on to your baby. During pregnancy is better than after pregnancy, but if you are unable to get it during pregnancy it will be offered at the hospital.  You can get this vaccine with us , at the health department, your family doctor, or some local pharmacies Everyone who will be around your baby should also be up-to-date on their vaccines before the baby comes. Adults (who are not pregnant) only need 1 dose of Tdap during adulthood.   [facility] Pediatricians/Family Doctors [facility]): [address] Meg Spina, [phone]           [facility]: [address], [phone]                [facility]): [address], [phone] (call to ask if accepting patients) [facility]: [address], [place], [phone]    [facility] Pediatricians/Family Doctors [facility]): [phone], [phone] [facility]: [address] [place], [phone] [facility]: [address], [phone]  [facility] Doctors  [facility]): [phone] [facility]: [address] [phone]   [facility] Doctors [facility]: [address], [phone]  [facility] Family Doctors  [facility]: [phone], [phone]  Home Blood Pressure Monitoring for Patients   Your provider has recommended that you check your  blood pressure (BP) at least once a week at home. If you do not have a blood pressure cuff at home, one will be provided for you. Contact your provider if you have not received your monitor within 1 week.   Helpful Tips for Accurate Home Blood Pressure Checks  Don't smoke, exercise, or drink caffeine 30 minutes before checking your BP Use the restroom before checking your BP (a full bladder can raise your  pressure) Relax in a comfortable upright chair Feet on the ground Left arm resting comfortably on a flat surface at the level of your heart Legs uncrossed Back supported Sit quietly and don't talk Place the cuff on your bare arm Adjust snuggly, so that only two fingertips can fit between your skin and the top of the cuff Check 2 readings separated by at least one minute Keep a log of your BP readings For a visual, please reference this diagram: http://ccnc.care/bpdiagram  Provider Name: [facility]     Phone: [phone]  Zone 1: ALL CLEAR  Continue to monitor your symptoms:  BP reading is less than 140 (top number) or less than 90 (bottom number)  No right upper stomach pain No headaches or seeing spots No feeling nauseated or throwing up No swelling in face and hands  Zone 2: CAUTION Call your doctor's office for any of the following:  BP reading is greater than 140 (top number) or greater than 90 (bottom number)  Stomach pain under your ribs in the middle or right side Headaches or seeing spots Feeling nauseated or throwing up Swelling in face and hands  Zone 3: EMERGENCY  Seek immediate medical care if you have any of the following:  BP reading is greater than160 (top number) or greater than 110 (bottom number) Severe headaches not improving with Tylenol  Serious difficulty catching your breath Any worsening symptoms from Zone 2   Third Trimester of Pregnancy The third trimester is from week 29 through week 42, months 7 through 9. The third trimester is a time when the fetus is growing rapidly. At the end of the ninth month, the fetus is about 20 inches in length and weighs 6-10 pounds.  BODY CHANGES Your body goes through many changes during pregnancy. The changes vary from woman to woman.  Your weight will continue to increase. You can expect to gain 25-35 pounds (11-16 kg) by the end of the pregnancy. You may begin to get stretch marks on your hips, abdomen,  and breasts. You may urinate more often because the fetus is moving lower into your pelvis and pressing on your bladder. You may develop or continue to have heartburn as a result of your pregnancy. You may develop constipation because certain hormones are causing the muscles that push waste through your intestines to slow down. You may develop hemorrhoids or swollen, bulging veins (varicose veins). You may have pelvic pain because of the weight gain and pregnancy hormones relaxing your joints between the bones in your pelvis. Backaches may result from overexertion of the muscles supporting your posture. You may have changes in your hair. These can include thickening of your hair, rapid growth, and changes in texture. Some women also have hair loss during or after pregnancy, or hair that feels dry or thin. Your hair will most likely return to normal after your baby is born. Your breasts will continue to grow and be tender. A yellow discharge may leak from your breasts called colostrum. Your belly button may stick out. You may  feel short of breath because of your expanding uterus. You may notice the fetus "dropping," or moving lower in your abdomen. You may have a bloody mucus discharge. This usually occurs a few days to a week before labor begins. Your cervix becomes thin and soft (effaced) near your due date. WHAT TO EXPECT AT YOUR PRENATAL EXAMS  You will have prenatal exams every 2 weeks until week 36. Then, you will have weekly prenatal exams. During a routine prenatal visit: You will be weighed to make sure you and the fetus are growing normally. Your blood pressure is taken. Your abdomen will be measured to track your baby's growth. The fetal heartbeat will be listened to. Any test results from the previous visit will be discussed. You may have a cervical check near your due date to see if you have effaced. At around 36 weeks, your caregiver will check your cervix. At the same time, your  caregiver will also perform a test on the secretions of the vaginal tissue. This test is to determine if a type of bacteria, Group B streptococcus, is present. Your caregiver will explain this further. Your caregiver may ask you: What your birth plan is. How you are feeling. If you are feeling the baby move. If you have had any abnormal symptoms, such as leaking fluid, bleeding, severe headaches, or abdominal cramping. If you have any questions. Other tests or screenings that may be performed during your third trimester include: Blood tests that check for low iron levels (anemia). Fetal testing to check the health, activity level, and growth of the fetus. Testing is done if you have certain medical conditions or if there are problems during the pregnancy. FALSE LABOR You may feel small, irregular contractions that eventually go away. These are called Braxton Hicks contractions, or false labor. Contractions may last for hours, days, or even weeks before true labor sets in. If contractions come at regular intervals, intensify, or become painful, it is best to be seen by your caregiver.  SIGNS OF LABOR  Menstrual-like cramps. Contractions that are 5 minutes apart or less. Contractions that start on the top of the uterus and spread down to the lower abdomen and back. A sense of increased pelvic pressure or back pain. A watery or bloody mucus discharge that comes from the vagina. If you have any of these signs before the 37th week of pregnancy, call your caregiver right away. You need to go to the hospital to get checked immediately. HOME CARE INSTRUCTIONS  Avoid all smoking, herbs, alcohol, and unprescribed drugs. These chemicals affect the formation and growth of the baby. Follow your caregiver's instructions regarding medicine use. There are medicines that are either safe or unsafe to take during pregnancy. Exercise only as directed by your caregiver. Experiencing uterine cramps is a good sign to  stop exercising. Continue to eat regular, healthy meals. Wear a good support bra for breast tenderness. Do not use hot tubs, steam rooms, or saunas. Wear your seat belt at all times when driving. Avoid raw meat, uncooked cheese, cat litter boxes, and soil used by cats. These carry germs that can cause birth defects in the baby. Take your prenatal vitamins. Try taking a stool softener (if your caregiver approves) if you develop constipation. Eat more high-fiber foods, such as fresh vegetables or fruit and whole grains. Drink plenty of fluids to keep your urine clear or pale yellow. Take warm sitz baths to soothe any pain or discomfort caused by hemorrhoids. Use hemorrhoid cream if  your caregiver approves. If you develop varicose veins, wear support hose. Elevate your feet for 15 minutes, 3-4 times a day. Limit salt in your diet. Avoid heavy lifting, wear low heal shoes, and practice good posture. Rest a lot with your legs elevated if you have leg cramps or low back pain. Visit your dentist if you have not gone during your pregnancy. Use a soft toothbrush to brush your teeth and be gentle when you floss. A sexual relationship may be continued unless your caregiver directs you otherwise. Do not travel far distances unless it is absolutely necessary and only with the approval of your caregiver. Take prenatal classes to understand, practice, and ask questions about the labor and delivery. Make a trial run to the hospital. Pack your hospital bag. Prepare the baby's nursery. Continue to go to all your prenatal visits as directed by your caregiver. SEEK MEDICAL CARE IF: You are unsure if you are in labor or if your water  has broken. You have dizziness. You have mild pelvic cramps, pelvic pressure, or nagging pain in your abdominal area. You have persistent nausea, vomiting, or diarrhea. You have a bad smelling vaginal discharge. You have pain with urination. SEEK IMMEDIATE MEDICAL CARE IF:  You  have a fever. You are leaking fluid from your vagina. You have spotting or bleeding from your vagina. You have severe abdominal cramping or pain. You have rapid weight loss or gain. You have shortness of breath with chest pain. You notice sudden or extreme swelling of your face, hands, ankles, feet, or legs. You have not felt your baby move in over an hour. You have severe headaches that do not go away with medicine. You have vision changes. Document Released: 09/21/2001 Document Revised: 10/02/2013 Document Reviewed: 11/28/2012 [facility] Patient Information 2015 [place], [place]. This information is not intended to replace advice given to you by your health care provider. Make sure you discuss any questions you have with your health care provider.

## 2024-03-14 NOTE — Progress Notes (Signed)
   LOW-RISK PREGNANCY VISIT Patient name: Jasmine Pierce MRN [id number]  Date of birth: 10-25-2000 Chief Complaint:   Routine Prenatal Visit  History of Present Illness:   Jasmine Pierce is a 23 y.o. [phone] female at [redacted]w[redacted]d with an Estimated Date of Delivery: 05/19/24 being seen today for ongoing management of a low-risk pregnancy.  Today she reports no complaints. Contractions: Irritability. Vag. Bleeding: None.  Movement: Present. denies leaking of fluid. Review of Systems:   Pertinent items are noted in HPI Denies abnormal vaginal discharge w/ itching/odor/irritation, headaches, visual changes, shortness of breath, chest pain, abdominal pain, severe nausea/vomiting, or problems with urination or bowel movements unless otherwise stated above. Pertinent History Reviewed:  Reviewed past medical,surgical, social, obstetrical and family history.  Reviewed problem list, medications and allergies. Physical Assessment:   Vitals:   03/14/24 1454  BP: 107/70  Pulse: 80  Weight: 147 lb (66.7 kg)  Body mass index is 26.04 kg/m.        Physical Examination:   General appearance: Well appearing, and in no distress  Mental status: Alert, oriented to person, place, and time  Skin: Warm & dry  Cardiovascular: Normal heart rate noted  Respiratory: Normal respiratory effort, no distress  Abdomen: Soft, gravid, nontender  Pelvic: Cervical exam deferred         Extremities:    Fetal Status: Fetal Heart Rate (bpm): 145 Fundal Height: 30 cm Movement: Present    No results found for this or any previous visit (from the past 24 hours).  Assessment & Plan:  1) Low-risk pregnancy [id number] at [redacted]w[redacted]d with an Estimated Date of Delivery: 05/19/24   2) Hx FGR, 28wk EFW 47%  3) Hx C/S>VBAC, wants TOLAC; will have MD visit coming up to sign consent   Meds: No orders of the defined types were placed in this encounter.  Labs/procedures today: Tdap  Plan:  Continue routine obstetrical care   Reviewed: Preterm  labor symptoms and general obstetric precautions including but not limited to vaginal bleeding, contractions, leaking of fluid and fetal movement were reviewed in detail with the patient.  All questions were answered. Has home bp cuff. Check bp weekly, let us  know if >140/90.   Follow-up: Return for LROB 2wks, 4wks, 6wks (one of these visits with MD to sign TOLAC consent).  No orders of the defined types were placed in this encounter.  Jolayne Natter CNM 03/14/2024 3:28 PM

## 2024-03-28 ENCOUNTER — Encounter: Payer: Self-pay | Admitting: Women's Health

## 2024-03-28 ENCOUNTER — Ambulatory Visit: Admitting: Women's Health

## 2024-03-28 VITALS — BP 109/72 | HR 69 | Wt 146.0 lb

## 2024-03-28 DIAGNOSIS — O09293 Supervision of pregnancy with other poor reproductive or obstetric history, third trimester: Secondary | ICD-10-CM | POA: Diagnosis not present

## 2024-03-28 DIAGNOSIS — O34219 Maternal care for unspecified type scar from previous cesarean delivery: Secondary | ICD-10-CM | POA: Diagnosis not present

## 2024-03-28 DIAGNOSIS — Z3A32 32 weeks gestation of pregnancy: Secondary | ICD-10-CM

## 2024-03-28 DIAGNOSIS — Z8759 Personal history of other complications of pregnancy, childbirth and the puerperium: Secondary | ICD-10-CM

## 2024-03-28 DIAGNOSIS — Z3483 Encounter for supervision of other normal pregnancy, third trimester: Secondary | ICD-10-CM

## 2024-03-28 NOTE — Patient Instructions (Signed)
 Jasmine Pierce, thank you for choosing our office today! We appreciate the opportunity to meet your healthcare needs. You may receive a short survey by mail, e-mail, or through [employer]. If you are happy with your care we would appreciate if you could take just a few minutes to complete the survey questions. We read all of your comments and take your feedback very seriously. Thank you again for choosing our office.  Center for [employer] Team at [facility]  [facility] at [facility] ([address] [place], [place] [address]) Entrance C, located off of [address] Free 24/7 valet parking   CLASSES: Go to [email or web address] to register for classes (childbirth, breastfeeding, waterbirth, infant CPR, daddy bootcamp, etc.)  Call the office [phone]) or go to [facility] if: You begin to have strong, frequent contractions Your water breaks.  Sometimes it is a big gush of fluid, sometimes it is just a trickle that keeps getting your panties wet or running down your legs You have vaginal bleeding.  It is normal to have a small amount of spotting if your cervix was checked.  You don't feel your baby moving like normal.  If you don't, get you something to eat and drink and lay down and focus on feeling your baby move.   If your baby is still not moving like normal, you should call the office or go to [facility]  Call the office [phone]) or go to [facility] hospital for these signs of pre-eclampsia: Severe headache that does not go away with Tylenol Visual changes- seeing spots, double, blurred vision Pain under your right breast or upper abdomen that does not go away with Tums or heartburn medicine Nausea and/or vomiting Severe swelling in your hands, feet, and face   Tdap Vaccine It is recommended that you get the Tdap vaccine during the third trimester of EACH pregnancy to help protect your baby from getting pertussis (whooping cough) 27-36 weeks is the BEST time to do  this so that you can pass the protection on to your baby. During pregnancy is better than after pregnancy, but if you are unable to get it during pregnancy it will be offered at the hospital.  You can get this vaccine with [place], at the health department, your family doctor, or some local pharmacies Everyone who will be around your baby should also be up-to-date on their vaccines before the baby comes. Adults (who are not pregnant) only need 1 dose of Tdap during adulthood.   [facility] Pediatricians/Family Doctors [facility]): [address] Colette Ribas, [phone]           [facility]: [address], [phone]                [facility] Medicine [facility]): [address], [phone] (call to ask if accepting patients) [facility] Department: [address], [place], [phone]    [facility] Pediatricians/Family Doctors [facility]): [phone] [address], [phone] [facility]: [address] [place], [phone] [facility]: [address], [phone]  [facility] Doctors  [facility]): [phone] [facility]: [address], [phone]   [facility] Doctors [facility]: [address] [phone]  [facility] Family Doctors  [facility]: [phone], [phone]  Home Blood Pressure Monitoring for Patients   Your provider has recommended that you check your  blood pressure (BP) at least once a week at home. If you do not have a blood pressure cuff at home, one will be provided for you. Contact your provider if you have not received your monitor within 1 week.   Helpful Tips for Accurate Home Blood Pressure Checks  Don't smoke, exercise, or drink caffeine 30 minutes before checking your BP Use the restroom before checking your BP (a full bladder can raise your  pressure) Relax in a comfortable upright chair Feet on the ground Left arm resting comfortably on a flat surface at the level of your heart Legs uncrossed Back supported Sit quietly and don't talk Place the cuff on your bare arm Adjust snuggly, so that only two fingertips can fit between your skin and the top of the cuff Check 2 readings separated by at least one minute Keep a log of your BP readings For a visual, please reference this diagram: http://ccnc.care/bpdiagram  Provider Name: [facility]     Phone: [phone]  Zone 1: ALL CLEAR  Continue to monitor your symptoms:  BP reading is less than 140 (top number) or less than 90 (bottom number)  No right upper stomach pain No headaches or seeing spots No feeling nauseated or throwing up No swelling in face and hands  Zone 2: CAUTION Call your doctor's office for any of the following:  BP reading is greater than 140 (top number) or greater than 90 (bottom number)  Stomach pain under your ribs in the middle or right side Headaches or seeing spots Feeling nauseated or throwing up Swelling in face and hands  Zone 3: EMERGENCY  Seek immediate medical care if you have any of the following:  BP reading is greater than160 (top number) or greater than 110 (bottom number) Severe headaches not improving with Tylenol Serious difficulty catching your breath Any worsening symptoms from Zone 2  Preterm Labor and Birth Information  The normal length of a pregnancy is 39-41 weeks. Preterm labor is when labor starts before 37 completed weeks of pregnancy. What are the risk factors for preterm labor? Preterm labor is more likely to occur in women who: Have certain infections during pregnancy such as a bladder infection, sexually transmitted infection, or infection inside the uterus (chorioamnionitis). Have a shorter-than-normal cervix. Have gone into preterm labor before. Have had surgery on their cervix. Are younger than age 57  or older than age 55. Are African American. Are pregnant with twins or multiple babies (multiple gestation). Take street drugs or smoke while pregnant. Do not gain enough weight while pregnant. Became pregnant shortly after having been pregnant. What are the symptoms of preterm labor? Symptoms of preterm labor include: Cramps similar to those that can happen during a menstrual period. The cramps may happen with diarrhea. Pain in the abdomen or lower back. Regular uterine contractions that may feel like tightening of the abdomen. A feeling of increased pressure in the pelvis. Increased watery or bloody mucus discharge from the vagina. Water breaking (ruptured amniotic sac). Why is it important to recognize signs of preterm labor? It is important to recognize signs of preterm labor because babies who are born prematurely may not be fully developed. This can put them at an increased risk for: Long-term (chronic) heart and lung problems. Difficulty immediately after birth with regulating body systems, including blood sugar, body temperature, heart rate, and breathing rate. Bleeding in the brain. Cerebral palsy. Learning difficulties. Death. These risks are highest for babies who are born before 34 weeks  of pregnancy. How is preterm labor treated? Treatment depends on the length of your pregnancy, your condition, and the health of your baby. It may involve: Having a stitch (suture) placed in your cervix to prevent your cervix from opening too early (cerclage). Taking or being given medicines, such as: Hormone medicines. These may be given early in pregnancy to help support the pregnancy. Medicine to stop contractions. Medicines to help mature the baby's lungs. These may be prescribed if the risk of delivery is high. Medicines to prevent your baby from developing cerebral palsy. If the labor happens before 34 weeks of pregnancy, you may need to stay in the hospital. What should I do if I  think I am in preterm labor? If you think that you are going into preterm labor, call your health care provider right away. How can I prevent preterm labor in future pregnancies? To increase your chance of having a full-term pregnancy: Do not use any tobacco products, such as cigarettes, chewing tobacco, and e-cigarettes. If you need help quitting, ask your health care provider. Do not use street drugs or medicines that have not been prescribed to you during your pregnancy. Talk with your health care provider before taking any herbal supplements, even if you have been taking them regularly. Make sure you gain a healthy amount of weight during your pregnancy. Watch for infection. If you think that you might have an infection, get it checked right away. Make sure to tell your health care provider if you have gone into preterm labor before. This information is not intended to replace advice given to you by your health care provider. Make sure you discuss any questions you have with your health care provider. Document Revised: 01/19/2019 Document Reviewed: 02/18/2016 Elsevier Patient Education  2020 [employer].

## 2024-03-28 NOTE — Progress Notes (Signed)
 LOW-RISK PREGNANCY VISIT Patient name: Jasmine Pierce MRN [id number]  Date of birth: 11/27/2000 Chief Complaint:   Routine Prenatal Visit  History of Present Illness:   Jasmine Pierce is a 23 y.o. [phone] female at [redacted]w[redacted]d with an Estimated Date of Delivery: 05/19/24 being seen today for ongoing management of a low-risk pregnancy.   Today she reports lots of contractions, are more painful than she remembers w/ prev pregnancies. Staying well hydrated. No UTI sx. Denies abnormal discharge, itching/odor/irritation.   Contractions: Irritability. Vag. Bleeding: None.  Movement: Present. denies leaking of fluid.     11/07/2023   11:02 AM 01/26/2023    3:53 PM 05/10/2022    9:52 AM 03/31/2020   10:39 AM 07/27/2019   11:49 AM  Depression screen PHQ 2/9  Decreased Interest 0 1 0 0 1  Down, Depressed, Hopeless 0 1 0 1 0  PHQ - 2 Score 0 2 0 1 1  Altered sleeping 0 1 1 0 3  Tired, decreased energy 1 2 1 1 1   Change in appetite 0 3 0 1 1  Feeling bad or failure about yourself  0 1 0 0 0  Trouble concentrating 0 2 0 0 0  Moving slowly or fidgety/restless 0 1 0 0 0  Suicidal thoughts 0 0 0 0 0  PHQ-9 Score 1 12 2 3 6   Difficult doing work/chores    Somewhat difficult         11/07/2023   11:02 AM 01/26/2023    3:53 PM 05/10/2022    9:52 AM 03/31/2020   10:39 AM  GAD 7 : Generalized Anxiety Score  Nervous, Anxious, on Edge 0 1 1 0  Control/stop worrying 0 2 1 1   Worry too much - different things 0 2 0 1  Trouble relaxing 0 1 1 1   Restless 0 1 0 0  Easily annoyed or irritable 0 3 1 2   Afraid - awful might happen 0 1 1 2   Total GAD 7 Score 0 11 5 7   Anxiety Difficulty    Not difficult at all      Review of Systems:   Pertinent items are noted in HPI Denies abnormal vaginal discharge w/ itching/odor/irritation, headaches, visual changes, shortness of breath, chest pain, abdominal pain, severe nausea/vomiting, or problems with urination or bowel movements unless otherwise stated  above. Pertinent History Reviewed:  Reviewed past medical,surgical, social, obstetrical and family history.  Reviewed problem list, medications and allergies. Physical Assessment:   Vitals:   03/28/24 1512  BP: 109/72  Pulse: 69  Weight: 146 lb (66.2 kg)  Body mass index is 25.86 kg/m.        Physical Examination:   General appearance: Well appearing, and in no distress  Mental status: Alert, oriented to person, place, and time  Skin: Warm & dry  Cardiovascular: Normal heart rate noted  Respiratory: Normal respiratory effort, no distress  Abdomen: Soft, gravid, nontender  Pelvic: Cervical exam performed  Dilation: Closed Effacement (%): Thick Station: Ballotable  Extremities:    Fetal Status: Fetal Heart Rate (bpm): 142 Fundal Height: 31 cm Movement: Present    Chaperone: Lorean Rodes No results found for this or any previous visit (from the past 24 hours).  Assessment & Plan:  1) Low-risk pregnancy [id number] at [redacted]w[redacted]d with an Estimated Date of Delivery: 05/19/24   2) Prev c/s then VBAC, wants TOLAC (consent 3/18)  3) H/O FGR x 2> EFW 47% at 27.5  4) Contractions> cx closed, stay well  hydrated, reviewed ptl s/s, reasons to seek care   Meds: No orders of the defined types were placed in this encounter.  Labs/procedures today: SVE  Plan:  Continue routine obstetrical care  Next visit: prefers will be in person for u/s    Reviewed: Preterm labor symptoms and general obstetric precautions including but not limited to vaginal bleeding, contractions, leaking of fluid and fetal movement were reviewed in detail with the patient.  All questions were answered. Does have home bp cuff. Office bp cuff given: not applicable. Check bp weekly, let us  know if consistently >140 and/or >90.  Follow-up: Return for As scheduled, add EFW u/s to 7/2 visit please.  Future Appointments  Date Time Provider Department Center  04/11/2024  3:10 PM Keene Pastures, DO [facility]  04/25/2024  3:10  PM Ferd Householder, CNM [facility]    No orders of the defined types were placed in this encounter.  Ferd Householder CNM, [facility] 03/28/2024 3:32 PM

## 2024-04-11 ENCOUNTER — Encounter: Payer: Self-pay | Admitting: Obstetrics & Gynecology

## 2024-04-11 ENCOUNTER — Ambulatory Visit: Admitting: Obstetrics & Gynecology

## 2024-04-11 VITALS — BP 108/72 | HR 72 | Wt 146.8 lb

## 2024-04-11 DIAGNOSIS — Z98891 History of uterine scar from previous surgery: Secondary | ICD-10-CM

## 2024-04-11 DIAGNOSIS — Z3483 Encounter for supervision of other normal pregnancy, third trimester: Secondary | ICD-10-CM

## 2024-04-11 DIAGNOSIS — Z3A34 34 weeks gestation of pregnancy: Secondary | ICD-10-CM | POA: Diagnosis not present

## 2024-04-11 DIAGNOSIS — Z8759 Personal history of other complications of pregnancy, childbirth and the puerperium: Secondary | ICD-10-CM

## 2024-04-11 NOTE — Progress Notes (Signed)
   LOW-RISK PREGNANCY VISIT Patient name: Jasmine Pierce MRN [id number]  Date of birth: 17-Mar-2001 Chief Complaint:   Routine Prenatal Visit  History of Present Illness:   Jasmine Pierce is a 23 y.o. [id number] female at [redacted]w[redacted]d with an Estimated Date of Delivery: 05/19/24 being seen today for ongoing management of a low-risk pregnancy.   -h/o FGR x2 -Prior C-section x1, prior VBAC, desires TOLAC     11/07/2023   11:02 AM 01/26/2023    3:53 PM 05/10/2022    9:52 AM 03/31/2020   10:39 AM 07/27/2019   11:49 AM  Depression screen PHQ 2/9  Decreased Interest 0 1 0 0 1  Down, Depressed, Hopeless 0 1 0 1 0  PHQ - 2 Score 0 2 0 1 1  Altered sleeping 0 1 1 0 3  Tired, decreased energy 1 2 1 1 1   Change in appetite 0 3 0 1 1  Feeling bad or failure about yourself  0 1 0 0 0  Trouble concentrating 0 2 0 0 0  Moving slowly or fidgety/restless 0 1 0 0 0  Suicidal thoughts 0 0 0 0 0  PHQ-9 Score 1 12 2 3 6   Difficult doing work/chores    Somewhat difficult     Today she reports pelvic pressure and discomfort. Contractions: Irritability. Vag. Bleeding: None.  Movement: Present. denies leaking of fluid. Review of Systems:   Pertinent items are noted in HPI Denies abnormal vaginal discharge w/ itching/odor/irritation, headaches, visual changes, shortness of breath, chest pain, abdominal pain, severe nausea/vomiting, or problems with urination or bowel movements unless otherwise stated above. Pertinent History Reviewed:  Reviewed past medical,surgical, social, obstetrical and family history.  Reviewed problem list, medications and allergies.  Physical Assessment:   Vitals:   04/11/24 1515  BP: 108/72  Pulse: 72  Weight: 146 lb 12.8 oz (66.6 kg)  Body mass index is 26 kg/m.        Physical Examination:   General appearance: Well appearing, and in no distress  Mental status: Alert, oriented to person, place, and time  Skin: Warm & dry  Respiratory: Normal respiratory effort, no  distress  Abdomen: Soft, gravid, nontender  Pelvic: Cervical exam deferred         Extremities: Edema: None  Psych:  mood and affect appropriate  Fetal Status: Fetal Heart Rate (bpm): 130 Fundal Height: 34 cm Movement: Present    Chaperone: n/a    No results found for this or any previous visit (from the past 24 hours).   Assessment & Plan:  1) Low-risk pregnancy [id number] at [redacted]w[redacted]d with an Estimated Date of Delivery: 05/19/24   -desires TOLAC- consent previously obtained -h/o FGR- follow up growth scan scheduled   Meds: No orders of the defined types were placed in this encounter.  Labs/procedures today: none  Plan:  Continue routine obstetrical care  Next visit: prefers in person    Reviewed: Preterm labor symptoms and general obstetric precautions including but not limited to vaginal bleeding, contractions, leaking of fluid and fetal movement were reviewed in detail with the patient.  All questions were answered.   Follow-up: Return in about 2 weeks (around 04/25/2024) for LROB visit and US  as scheduled.  No orders of the defined types were placed in this encounter.   Yasira Engelson, DO Attending Obstetrician & Gynecologist, [facility] for [employer], [facility]

## 2024-04-12 ENCOUNTER — Encounter: Payer: Self-pay | Admitting: Women's Health

## 2024-04-18 ENCOUNTER — Other Ambulatory Visit: Payer: Self-pay | Admitting: Obstetrics & Gynecology

## 2024-04-18 ENCOUNTER — Other Ambulatory Visit

## 2024-04-18 DIAGNOSIS — Z8759 Personal history of other complications of pregnancy, childbirth and the puerperium: Secondary | ICD-10-CM

## 2024-04-18 DIAGNOSIS — Z3A35 35 weeks gestation of pregnancy: Secondary | ICD-10-CM

## 2024-04-18 DIAGNOSIS — Z3480 Encounter for supervision of other normal pregnancy, unspecified trimester: Secondary | ICD-10-CM

## 2024-04-18 DIAGNOSIS — O365931 Maternal care for other known or suspected poor fetal growth, third trimester, fetus 1: Secondary | ICD-10-CM | POA: Diagnosis not present

## 2024-04-18 NOTE — Progress Notes (Signed)
 US  35+4 wks,cephalic,FHR 121 bpm,anterior placenta gr 1,AFI 14 cm,elevated UAD with EDF,RI .69,.71,.75=95%,BPP 8/8

## 2024-04-19 ENCOUNTER — Other Ambulatory Visit: Payer: Self-pay | Admitting: Women's Health

## 2024-04-19 ENCOUNTER — Encounter: Payer: Self-pay | Admitting: Obstetrics & Gynecology

## 2024-04-19 DIAGNOSIS — O0993 Supervision of high risk pregnancy, unspecified, third trimester: Secondary | ICD-10-CM

## 2024-04-24 ENCOUNTER — Telehealth (HOSPITAL_COMMUNITY): Payer: Self-pay | Admitting: *Deleted

## 2024-04-24 ENCOUNTER — Encounter (HOSPITAL_COMMUNITY): Payer: Self-pay

## 2024-04-24 ENCOUNTER — Ambulatory Visit

## 2024-04-24 VITALS — BP 107/71 | HR 74

## 2024-04-24 DIAGNOSIS — O0993 Supervision of high risk pregnancy, unspecified, third trimester: Secondary | ICD-10-CM | POA: Diagnosis not present

## 2024-04-24 DIAGNOSIS — Z3A36 36 weeks gestation of pregnancy: Secondary | ICD-10-CM | POA: Diagnosis not present

## 2024-04-24 NOTE — Telephone Encounter (Signed)
 Preadmission screen

## 2024-04-24 NOTE — Progress Notes (Signed)
   NURSE VISIT- NST  SUBJECTIVE:  Jasmine Pierce is a 23 y.o. [phone] female at [redacted]w[redacted]d, here for a NST for pregnancy complicated by FGR.  She reports active fetal movement, contractions: none, vaginal bleeding: none, membranes: intact.   OBJECTIVE:  LMP 08/13/2023   Appears well, no apparent distress  No results found for this or any previous visit (from the past 24 hours).  NST: FHR baseline 140 bpm, Variability: moderate, Accelerations:present, Decelerations:  Absent= Cat 1/reactive Toco: x2 UC   ASSESSMENT: [id number] at [redacted]w[redacted]d with FGR NST reactive  PLAN: EFM strip reviewed by Dr. Jayne   Recommendations: keep next appointment as scheduled    Talha Iser E Kyana Aicher  04/24/2024 9:30 AM

## 2024-04-25 ENCOUNTER — Other Ambulatory Visit: Payer: Self-pay | Admitting: Obstetrics & Gynecology

## 2024-04-25 ENCOUNTER — Encounter: Admitting: Women's Health

## 2024-04-25 DIAGNOSIS — O365990 Maternal care for other known or suspected poor fetal growth, unspecified trimester, not applicable or unspecified: Secondary | ICD-10-CM

## 2024-04-26 ENCOUNTER — Encounter (HOSPITAL_COMMUNITY): Payer: Self-pay | Admitting: *Deleted

## 2024-04-26 ENCOUNTER — Telehealth (HOSPITAL_COMMUNITY): Payer: Self-pay | Admitting: *Deleted

## 2024-04-26 NOTE — Telephone Encounter (Signed)
 Preadmission screen

## 2024-04-27 ENCOUNTER — Other Ambulatory Visit (HOSPITAL_COMMUNITY)
Admission: RE | Admit: 2024-04-27 | Discharge: 2024-04-27 | Disposition: A | Discharge status: Home / Self Care | Source: Ambulatory Visit | Attending: Obstetrics & Gynecology | Admitting: Obstetrics & Gynecology

## 2024-04-27 ENCOUNTER — Ambulatory Visit: Admitting: Obstetrics & Gynecology

## 2024-04-27 ENCOUNTER — Encounter: Payer: Self-pay | Admitting: Obstetrics & Gynecology

## 2024-04-27 ENCOUNTER — Other Ambulatory Visit

## 2024-04-27 VITALS — BP 105/68 | HR 74 | Wt 147.6 lb

## 2024-04-27 DIAGNOSIS — Z98891 History of uterine scar from previous surgery: Secondary | ICD-10-CM

## 2024-04-27 DIAGNOSIS — Z3480 Encounter for supervision of other normal pregnancy, unspecified trimester: Secondary | ICD-10-CM

## 2024-04-27 DIAGNOSIS — O365930 Maternal care for other known or suspected poor fetal growth, third trimester, not applicable or unspecified: Secondary | ICD-10-CM

## 2024-04-27 DIAGNOSIS — Z3A36 36 weeks gestation of pregnancy: Secondary | ICD-10-CM

## 2024-04-27 DIAGNOSIS — O0993 Supervision of high risk pregnancy, unspecified, third trimester: Secondary | ICD-10-CM

## 2024-04-27 DIAGNOSIS — O0990 Supervision of high risk pregnancy, unspecified, unspecified trimester: Secondary | ICD-10-CM | POA: Diagnosis not present

## 2024-04-27 DIAGNOSIS — Z113 Encounter for screening for infections with a predominantly sexual mode of transmission: Secondary | ICD-10-CM | POA: Insufficient documentation

## 2024-04-27 DIAGNOSIS — O365990 Maternal care for other known or suspected poor fetal growth, unspecified trimester, not applicable or unspecified: Secondary | ICD-10-CM

## 2024-04-27 NOTE — Progress Notes (Signed)
 HIGH-RISK PREGNANCY VISIT Patient name: Jasmine Pierce MRN [id number]  Date of birth: 2001-01-30 Chief Complaint:   Routine Prenatal Visit  History of Present Illness:   Moon Garcia is a 23 y.o. [phone] female at [redacted]w[redacted]d with an Estimated Date of Delivery: 05/19/24 being seen today for ongoing management of a high-risk pregnancy complicated by:  -FGR with elevated dopplers Diagnosed 7/9  -Prior C-section and VBAC, desires TOLAC  Today she reports no complaints.   Contractions: Not present. Denies vaginal bleeding .  Movement: Present. denies leaking of fluid.      11/07/2023   11:02 AM 01/26/2023    3:53 PM 05/10/2022    9:52 AM 03/31/2020   10:39 AM 07/27/2019   11:49 AM  Depression screen PHQ 2/9  Decreased Interest 0 1 0 0 1  Down, Depressed, Hopeless 0 1 0 1 0  PHQ - 2 Score 0 2 0 1 1  Altered sleeping 0 1 1 0 3  Tired, decreased energy 1 2 1 1 1   Change in appetite 0 3 0 1 1  Feeling bad or failure about yourself  0 1 0 0 0  Trouble concentrating 0 2 0 0 0  Moving slowly or fidgety/restless 0 1 0 0 0  Suicidal thoughts 0 0 0 0 0  PHQ-9 Score 1 12 2 3 6   Difficult doing work/chores    Somewhat difficult      Current Outpatient Medications  Medication Instructions   Blood Pressure Monitor MISC For regular home bp monitoring during pregnancy   prenatal vitamin w/FE, FA (PRENATAL 1 + 1) 27-1 MG TABS tablet Take 1 tablet by mouth daily     Review of Systems:   Pertinent items are noted in HPI Denies abnormal vaginal discharge w/ itching/odor/irritation, headaches, visual changes, shortness of breath, chest pain, abdominal pain, severe nausea/vomiting, or problems with urination or bowel movements unless otherwise stated above. Pertinent History Reviewed:  Reviewed past medical,surgical, social, obstetrical and family history.  Reviewed problem list, medications and allergies. Physical Assessment:   Vitals:   04/27/24 0955  BP: 105/68  Pulse: 74  Weight: 147 lb 9.6  oz (67 kg)  Body mass index is 26.15 kg/m.           Physical Examination:   General appearance: alert, well appearing, and in no distress  Mental status: normal mood, behavior, speech, dress, motor activity, and thought processes  Skin: warm & dry   Extremities:   no edema   Cardiovascular: normal heart rate noted  Respiratory: normal respiratory effort, no distress  Abdomen: gravid, soft, non-tender  Pelvic: Cervical exam deferred vaginal swabs collected        Fetal Status:     Movement: Present    Fetal Surveillance Testing today: BPP/dopplers   Chaperone: Alan Fischer    No results found for this or any previous visit (from the past 24 hours).   Assessment & Plan:  High-risk pregnancy: [id number] at [redacted]w[redacted]d with an Estimated Date of Delivery: 05/19/24   1. [redacted] weeks gestation of pregnancy (Primary)  - Cervicovaginal ancillary only( [facility]) - Culture, beta strep (group b only)  2. Supervision of high risk pregnancy, antepartum -unfortunately suspect cultures may not return prior to IOL due to timing of OB appointments in relation to recent diagnosis and IOL  3. History of cesarean delivery -desires tolac consent obtained  4. Fetal growth restriction antepartum -elevated dopplers noted, BPP 8/8 -plan to proceed as scheduled with IOL tomorrow  Meds: No orders of the defined types were placed in this encounter.   Labs/procedures today: GBS, GC/C, US   Treatment Plan:  as outlined above  Reviewed: Preterm labor symptoms and general obstetric precautions including but not limited to vaginal bleeding, contractions, leaking of fluid and fetal movement were reviewed in detail with the patient.  All questions were answered.  Follow-up: Return for IOL scheduled 7/19.   Future Appointments  Date Time Provider Department Center  04/28/2024  7:00 AM [facility] SCHED ROOM [facility] None    Orders Placed This Encounter  Procedures   Culture, beta strep (group b only)     Jalyiah Shelley, DO Attending Obstetrician & Gynecologist, [facility] for [employer], [facility]

## 2024-04-27 NOTE — Progress Notes (Signed)
 US  36+6 wks,cephalic,anterior placenta gr 2,BPP 8/8,AFI 11 cm,FHR 137 BPM,elevated UAD with EDF,RI .76,.79,.75,.75=99%

## 2024-04-28 ENCOUNTER — Inpatient Hospital Stay (HOSPITAL_COMMUNITY)

## 2024-04-28 NOTE — H&P (Signed)
 OBSTETRIC ADMISSION HISTORY AND PHYSICAL  Jasmine Pierce is a 23 y.o. female [phone] with IUP at [redacted]w[redacted]d by LMP presenting for IOL due to IUGR 6% with elevated UAD desiring TOLAC. She reports +FMs, No LOF, no VB, no blurry vision, headaches or peripheral edema, and RUQ pain.  She plans on breast feeding. She request POPs for birth control. She received her prenatal [facility] at [facility]   Dating: By LMP --->  Estimated Date of Delivery: 05/19/24  Sono:   @[redacted]w[redacted]d , CWD, normal female Enerio anatomy, cephalic presentation, anterior placental lie, 2289  grams, 12 % EFW, 6% AC, elevated UAD with EDF,RI .76,.79,.75,.75=99%    Prenatal History/Complications:  - IUGR 6% with elevated UAD - TOLAC; @ 36.5wks twinA breech, twinB w/ FGR, both twins w/ elevated dopplers... then VBAC, wants TOLAC - GAD & MDD not on medication - GBS unknown  Past Medical History: Past Medical History:  Diagnosis Date   Asthma    as a child   Miscarriage    Ovarian cyst    ruptured in 2016    Past Surgical History: Past Surgical History:  Procedure Laterality Date   CESAREAN SECTION MULTI-GESTATIONAL N/A 01/09/2020   Procedure: CESAREAN SECTION MULTI-GESTATIONAL;  Surgeon: Jayne Vonn DEL, MD;  Location: [facility];  Service: Obstetrics;  Laterality: N/A;   NO PAST SURGERIES     WISDOM TOOTH EXTRACTION      Obstetrical History: OB History     Gravida  4   Para  2   Term  1   Preterm  1   AB  1   Living  3      SAB  1   IAB  0   Ectopic  0   Multiple  1   Live Births  3           Social History Social History   Socioeconomic History   Marital status: Married    Spouse name: Not on file   Number of children: 2   Years of education: Not on file   Highest education level: High school graduate  Occupational History   Occupation: [employer]  Tobacco Use   Smoking status: Former    Types: Cigarettes   Smokeless tobacco: Never   Tobacco comments:    Quit months ago    Vaping Use   Vaping status: Former  Substance and Sexual Activity   Alcohol use: No   Drug use: No   Sexual activity: Yes    Birth control/protection: None  Other Topics Concern   Not on file  Social History Narrative   Not on file   Social Drivers of Health   Financial Resource Strain: Low Risk  (11/07/2023)   Overall Financial Resource Strain (CARDIA)    Difficulty of Paying Living Expenses: Not hard at all  Food Insecurity: No Food Insecurity (04/29/2024)   Hunger Vital Sign    Worried About Running Out of Food in the Last Year: Never true    Ran Out of Food in the Last Year: Never true  Transportation Needs: No Transportation Needs (04/29/2024)   PRAPARE - Administrator, Civil Service (Medical): No    Lack of Transportation (Non-Medical): No  Physical Activity: Sufficiently Active (11/07/2023)   Exercise Vital Sign    Days of Exercise per Week: 7 days    Minutes of Exercise per Session: 50 min  Stress: No Stress Concern Present (11/07/2023)   [employer] of Occupational Health -  Occupational Stress Questionnaire    Feeling of Stress : Not at all  Social Connections: Socially Isolated (01/26/2023)   Social Connection and Isolation Panel    Frequency of Communication with Friends and Family: Twice a week    Frequency of Social Gatherings with Friends and Family: Never    Attends Religious Services: Never    Diplomatic Services operational officer: No    Attends Engineer, structural: Never    Marital Status: Married    Family History: Family History  Problem Relation Age of Onset   Sickle cell trait Mother    Graves' disease Father    Asthma Maternal Aunt    Hypertension Maternal Grandmother    Diabetes Paternal Grandmother    Hypertension Paternal Grandmother    Hypertension Paternal Grandfather    Diabetes Paternal Grandfather    Chronic Renal Failure Other    Cancer Other        cervical   Colon cancer Other     Allergies: No  Known Allergies  Medications Prior to Admission  Medication Sig Dispense Refill Last Dose/Taking   prenatal vitamin w/FE, FA (PRENATAL 1 + 1) 27-1 MG TABS tablet Take 1 tablet by mouth daily 90 tablet 3 Past Week   Blood Pressure Monitor MISC For regular home bp monitoring during pregnancy (Patient not taking: Reported on 04/11/2024) 1 each 0      Review of Systems   All systems reviewed and negative except as stated in HPI  Blood pressure 108/73, pulse 68, temperature 97.6 F (36.4 C), temperature source Oral, resp. rate 18, height 5' 3 (1.6 m), weight 67.7 kg, last menstrual period 08/13/2023, not currently breastfeeding. General appearance: alert, cooperative, and appears stated age Lungs: clear to auscultation bilaterally Heart: regular rate and rhythm Abdomen: soft, non-tender; bowel sounds normal Pelvic: adequate Extremities: Homans sign is negative, no sign of DVT DTR's  +2 Presentation: cephalic Fetal monitoringBaseline: 120 bpm, Variability: Good {> 6 bpm), Accelerations: Reactive, and Decelerations: Absent Uterine activity: Occasiona contractions and Intensity: mild Dilation: 1.5 Effacement (%): 40 Station: -3 Exam by:: Camie Butler Potters CNM   Prenatal labs: ABO, Rh: A/Positive/-- (01/27 1230) Antibody: Negative (05/13 0817) Rubella: 3.59 (01/27 1230) RPR: Non Reactive (05/13 0817)  HBsAg: Negative (01/27 1230)  HIV: Non Reactive (05/13 0817)  GBS:      Lab Results  Component Value Date   GBS Positive (A) 01/04/2020   GTT wnl Genetic screening  negative, low risk Anatomy US  normal female  Immunization History  Administered Date(s) Administered   Influenza,inj,Quad PF,6+ Mos 07/27/2019   Tdap 01/12/2020, 03/09/2022, 03/14/2024    Prenatal Transfer Tool  Maternal Diabetes: No Genetic Screening: Normal Maternal Ultrasounds/Referrals: IUGR Fetal Ultrasounds or other Referrals:  None Maternal Substance Abuse:  No Significant Maternal Medications:   None Significant Maternal Lab Results: Other: GBS unknown Number of Prenatal Visits:greater than 3 verified prenatal visits Maternal Vaccinations:TDap and Flu Other Comments:  None   No results found for this or any previous visit (from the past 24 hours).  Patient Active Problem List   Diagnosis Date Noted   IUGR (intrauterine growth restriction) affecting care of mother 04/29/2024   History of poor fetal growth 03/14/2024   Encounter for supervision of normal pregnancy, antepartum 11/07/2023   Menorrhagia with irregular cycle 06/07/2023   Anxiety and depression 01/26/2023   Hemorrhoids 01/26/2023   Heart palpitations 01/26/2023   Hx of preterm delivery, currently pregnant 11/20/2021   S/P cesarean section 01/09/2020  Asthma 2001/08/20    Assessment/Plan:  Jasmine Pierce is a 23 y.o. H5E8886 at [redacted]w[redacted]d here for IOL for IUGR 6% with elevated UAD desiring TOLAC  #Labor:IOL in latent phase. RBA of FB d/w patient, patient consents to placement.  #Pain: Per pt request #FWB: Cat 1  #GBS status:  unknown #Feeding: Breastmilk  #Reproductive Life planning: Progesterone  only pills #Circ:  yes  Camie DELENA Rote, CNM  04/29/2024, 1:38 AM

## 2024-04-29 ENCOUNTER — Encounter (HOSPITAL_COMMUNITY): Payer: Self-pay | Admitting: Obstetrics & Gynecology

## 2024-04-29 ENCOUNTER — Other Ambulatory Visit: Payer: Self-pay

## 2024-04-29 ENCOUNTER — Inpatient Hospital Stay (HOSPITAL_COMMUNITY): Admitting: Anesthesiology

## 2024-04-29 ENCOUNTER — Inpatient Hospital Stay (HOSPITAL_COMMUNITY)
Admission: RE | Admit: 2024-04-29 | Discharge: 2024-05-01 | DRG: 807 | Disposition: A | Discharge status: Home / Self Care | Attending: Obstetrics & Gynecology | Admitting: Obstetrics & Gynecology

## 2024-04-29 DIAGNOSIS — Z3A37 37 weeks gestation of pregnancy: Secondary | ICD-10-CM | POA: Diagnosis not present

## 2024-04-29 DIAGNOSIS — Z833 Family history of diabetes mellitus: Secondary | ICD-10-CM

## 2024-04-29 DIAGNOSIS — Z87891 Personal history of nicotine dependence: Secondary | ICD-10-CM

## 2024-04-29 DIAGNOSIS — O99824 Streptococcus B carrier state complicating childbirth: Secondary | ICD-10-CM | POA: Diagnosis present

## 2024-04-29 DIAGNOSIS — J45909 Unspecified asthma, uncomplicated: Secondary | ICD-10-CM | POA: Diagnosis present

## 2024-04-29 DIAGNOSIS — O0993 Supervision of high risk pregnancy, unspecified, third trimester: Secondary | ICD-10-CM

## 2024-04-29 DIAGNOSIS — O365990 Maternal care for other known or suspected poor fetal growth, unspecified trimester, not applicable or unspecified: Secondary | ICD-10-CM | POA: Diagnosis not present

## 2024-04-29 DIAGNOSIS — Z8249 Family history of ischemic heart disease and other diseases of the circulatory system: Secondary | ICD-10-CM | POA: Diagnosis not present

## 2024-04-29 DIAGNOSIS — O365930 Maternal care for other known or suspected poor fetal growth, third trimester, not applicable or unspecified: Secondary | ICD-10-CM | POA: Diagnosis not present

## 2024-04-29 DIAGNOSIS — O34219 Maternal care for unspecified type scar from previous cesarean delivery: Principal | ICD-10-CM | POA: Diagnosis present

## 2024-04-29 DIAGNOSIS — O34211 Maternal care for low transverse scar from previous cesarean delivery: Secondary | ICD-10-CM | POA: Diagnosis not present

## 2024-04-29 DIAGNOSIS — O99820 Streptococcus B carrier state complicating pregnancy: Secondary | ICD-10-CM | POA: Diagnosis not present

## 2024-04-29 DIAGNOSIS — Z3490 Encounter for supervision of normal pregnancy, unspecified, unspecified trimester: Secondary | ICD-10-CM

## 2024-04-29 DIAGNOSIS — O326XX0 Maternal care for compound presentation, not applicable or unspecified: Secondary | ICD-10-CM | POA: Diagnosis not present

## 2024-04-29 DIAGNOSIS — O99344 Other mental disorders complicating childbirth: Secondary | ICD-10-CM | POA: Diagnosis not present

## 2024-04-29 LAB — CBC
HCT: 37.3 % (ref 36.0–46.0)
Hemoglobin: 12.9 g/dL (ref 12.0–15.0)
MCH: 29.6 pg (ref 26.0–34.0)
MCHC: 34.6 g/dL (ref 30.0–36.0)
MCV: 85.6 fL (ref 80.0–100.0)
Platelets: 186 K/uL (ref 150–400)
RBC: 4.36 MIL/uL (ref 3.87–5.11)
RDW: 12.9 % (ref 11.5–15.5)
WBC: 7.3 K/uL (ref 4.0–10.5)
nRBC: 0 % (ref 0.0–0.2)

## 2024-04-29 LAB — RPR: RPR Ser Ql: NONREACTIVE

## 2024-04-29 LAB — TYPE AND SCREEN
ABO/RH(D): A POS
Antibody Screen: NEGATIVE

## 2024-04-29 LAB — GROUP B STREP BY PCR: Group B strep by PCR: POSITIVE — AB

## 2024-04-29 MED ORDER — IBUPROFEN 600 MG PO TABS
600.0000 mg | ORAL_TABLET | Freq: Four times a day (QID) | ORAL | Status: DC
  Administered 2024-04-29 – 2024-05-01 (×7): 600 mg via ORAL
  Filled 2024-04-29 (×8): qty 1

## 2024-04-29 MED ORDER — FLEET ENEMA RE ENEM: 1.0000 | ENEMA | RECTAL | Status: DC | PRN

## 2024-04-29 MED ORDER — ZOLPIDEM TARTRATE 5 MG PO TABS: 5.0000 mg | ORAL_TABLET | Freq: Every evening | ORAL | Status: DC | PRN

## 2024-04-29 MED ORDER — OXYTOCIN-SODIUM CHLORIDE 30-0.9 UT/500ML-% IV SOLN: 1.0000 m[IU]/min | INTRAVENOUS | Status: DC

## 2024-04-29 MED ORDER — SODIUM CHLORIDE 0.9 % IV SOLN
5.0000 10*6.[IU] | Freq: Once | INTRAVENOUS | Status: AC
  Administered 2024-04-29: 5 10*6.[IU] via INTRAVENOUS
  Filled 2024-04-29: qty 5

## 2024-04-29 MED ORDER — PRENATAL MULTIVITAMIN CH
1.0000 | ORAL_TABLET | Freq: Every day | ORAL | Status: DC
  Administered 2024-04-30: 1 via ORAL
  Filled 2024-04-29: qty 1

## 2024-04-29 MED ORDER — FENTANYL-BUPIVACAINE-NACL 0.5-0.125-0.9 MG/250ML-% EP SOLN
EPIDURAL | Status: AC
  Filled 2024-04-29: qty 250

## 2024-04-29 MED ORDER — OXYCODONE-ACETAMINOPHEN 5-325 MG PO TABS: 2.0000 | ORAL_TABLET | ORAL | Status: DC | PRN

## 2024-04-29 MED ORDER — PENICILLIN G POT IN DEXTROSE 60000 UNIT/ML IV SOLN
3.0000 10*6.[IU] | INTRAVENOUS | Status: DC
  Administered 2024-04-29 (×2): 3 10*6.[IU] via INTRAVENOUS
  Filled 2024-04-29 (×2): qty 50

## 2024-04-29 MED ORDER — OXYTOCIN BOLUS FROM INFUSION
333.0000 mL | Freq: Once | INTRAVENOUS | Status: DC
  Administered 2024-04-29: 333 mL via INTRAVENOUS

## 2024-04-29 MED ORDER — PHENYLEPHRINE 80 MCG/ML (10ML) SYRINGE FOR IV PUSH (FOR BLOOD PRESSURE SUPPORT)
PREFILLED_SYRINGE | INTRAVENOUS | Status: AC
  Administered 2024-04-29: 80 ug via INTRAVENOUS
  Filled 2024-04-29: qty 10

## 2024-04-29 MED ORDER — EPHEDRINE 5 MG/ML INJ
10.0000 mg | INTRAVENOUS | Status: DC | PRN
  Administered 2024-04-29: 10 mg via INTRAVENOUS
  Filled 2024-04-29 (×2): qty 5

## 2024-04-29 MED ORDER — OXYTOCIN-SODIUM CHLORIDE 30-0.9 UT/500ML-% IV SOLN
2.5000 [IU]/h | INTRAVENOUS | Status: DC
  Administered 2024-04-29: 2.5 [IU]/h via INTRAVENOUS
  Filled 2024-04-29: qty 500

## 2024-04-29 MED ORDER — PHENYLEPHRINE 80 MCG/ML (10ML) SYRINGE FOR IV PUSH (FOR BLOOD PRESSURE SUPPORT)
80.0000 ug | PREFILLED_SYRINGE | INTRAVENOUS | Status: AC | PRN
  Administered 2024-04-29 (×3): 80 ug via INTRAVENOUS

## 2024-04-29 MED ORDER — OXYCODONE-ACETAMINOPHEN 5-325 MG PO TABS: 1.0000 | ORAL_TABLET | ORAL | Status: DC | PRN

## 2024-04-29 MED ORDER — LACTATED RINGERS IV SOLN
500.0000 mL | Freq: Once | INTRAVENOUS | Status: AC
  Administered 2024-04-29: 500 mL via INTRAVENOUS

## 2024-04-29 MED ORDER — LACTATED RINGERS IV SOLN: INTRAVENOUS | Status: DC

## 2024-04-29 MED ORDER — ONDANSETRON HCL 4 MG/2ML IJ SOLN: 4.0000 mg | Freq: Four times a day (QID) | INTRAMUSCULAR | Status: DC | PRN

## 2024-04-29 MED ORDER — DIPHENHYDRAMINE HCL 25 MG PO CAPS: 25.0000 mg | ORAL_CAPSULE | Freq: Four times a day (QID) | ORAL | Status: DC | PRN

## 2024-04-29 MED ORDER — TERBUTALINE SULFATE 1 MG/ML IJ SOLN: 0.2500 mg | Freq: Once | INTRAMUSCULAR | Status: DC | PRN

## 2024-04-29 MED ORDER — LIDOCAINE HCL (PF) 1 % IJ SOLN
INTRAMUSCULAR | Status: DC | PRN
  Administered 2024-04-29: 5 mL via EPIDURAL

## 2024-04-29 MED ORDER — EPHEDRINE 5 MG/ML INJ
10.0000 mg | INTRAVENOUS | Status: AC | PRN
  Administered 2024-04-29 (×2): 10 mg via INTRAVENOUS

## 2024-04-29 MED ORDER — ACETAMINOPHEN 325 MG PO TABS: 650.0000 mg | ORAL_TABLET | ORAL | Status: DC | PRN

## 2024-04-29 MED ORDER — DIBUCAINE (PERIANAL) 1 % EX OINT: 1.0000 | TOPICAL_OINTMENT | CUTANEOUS | Status: DC | PRN

## 2024-04-29 MED ORDER — FENTANYL CITRATE (PF) 100 MCG/2ML IJ SOLN: 100.0000 ug | INTRAMUSCULAR | Status: DC | PRN

## 2024-04-29 MED ORDER — SENNOSIDES-DOCUSATE SODIUM 8.6-50 MG PO TABS
2.0000 | ORAL_TABLET | Freq: Every day | ORAL | Status: DC
  Filled 2024-04-29: qty 2

## 2024-04-29 MED ORDER — SOD CITRATE-CITRIC ACID 500-334 MG/5ML PO SOLN: 30.0000 mL | ORAL | Status: DC | PRN

## 2024-04-29 MED ORDER — FENTANYL-BUPIVACAINE-NACL 0.5-0.125-0.9 MG/250ML-% EP SOLN: 12.0000 mL/h | EPIDURAL | Status: DC | PRN

## 2024-04-29 MED ORDER — FENTANYL-BUPIVACAINE-NACL 0.5-0.125-0.9 MG/250ML-% EP SOLN
EPIDURAL | Status: DC | PRN
  Administered 2024-04-29: 12 mL/h via EPIDURAL

## 2024-04-29 MED ORDER — DIPHENHYDRAMINE HCL 50 MG/ML IJ SOLN: 12.5000 mg | INTRAMUSCULAR | Status: DC | PRN

## 2024-04-29 MED ORDER — BENZOCAINE-MENTHOL 20-0.5 % EX AERO
1.0000 | INHALATION_SPRAY | CUTANEOUS | Status: DC | PRN
Start: 2024-04-29 — End: 2024-05-01

## 2024-04-29 MED ORDER — LACTATED RINGERS IV SOLN
500.0000 mL | INTRAVENOUS | Status: DC | PRN
  Administered 2024-04-29: 500 mL via INTRAVENOUS
  Administered 2024-04-29: 250 mL via INTRAVENOUS

## 2024-04-29 MED ORDER — ONDANSETRON HCL 4 MG/2ML IJ SOLN: 4.0000 mg | INTRAMUSCULAR | Status: DC | PRN

## 2024-04-29 MED ORDER — COCONUT OIL OIL
1.0000 | TOPICAL_OIL | Status: DC | PRN
Start: 2024-04-29 — End: 2024-05-01
  Administered 2024-04-30 – 2024-05-01 (×2): 1 via TOPICAL

## 2024-04-29 MED ORDER — LIDOCAINE HCL (PF) 1 % IJ SOLN: 30.0000 mL | INTRAMUSCULAR | Status: DC | PRN

## 2024-04-29 MED ORDER — HYDROXYZINE HCL 50 MG PO TABS: 50.0000 mg | ORAL_TABLET | Freq: Four times a day (QID) | ORAL | Status: DC | PRN

## 2024-04-29 MED ORDER — WITCH HAZEL-GLYCERIN EX PADS: 1.0000 | MEDICATED_PAD | CUTANEOUS | Status: DC | PRN

## 2024-04-29 MED ORDER — ONDANSETRON HCL 4 MG PO TABS
4.0000 mg | ORAL_TABLET | ORAL | Status: DC | PRN
Start: 2024-04-29 — End: 2024-05-01

## 2024-04-29 MED ORDER — PHENYLEPHRINE 80 MCG/ML (10ML) SYRINGE FOR IV PUSH (FOR BLOOD PRESSURE SUPPORT): 80.0000 ug | PREFILLED_SYRINGE | INTRAVENOUS | Status: DC | PRN

## 2024-04-29 MED ORDER — SIMETHICONE 80 MG PO CHEW: 80.0000 mg | CHEWABLE_TABLET | ORAL | Status: DC | PRN

## 2024-04-29 NOTE — Progress Notes (Signed)
 LABOR PROGRESS NOTE  Patient Name: Jasmine Pierce, female   DOB: 05-21-2001, 23 y.o.  MRN: [id number]  To bedside to assess patient. Feeling moderate, regular contractions. Amenable to AROM. R/B/A of AROM discussed with patient, and verbal consent obtained. Babe's head found to be well-applied. Obtained small clear fluid with bloody show. Mom and babe tolerated well. Cat I. Patient would like to avoid pit if possible. Will encourage movement and position changes to promote continued progress.  Almarie CHRISTELLA Moats, MD

## 2024-04-29 NOTE — Lactation Note (Signed)
 This note was copied from a baby's chart. Lactation Consultation Note  Patient Name: Jasmine Pierce Unijb'd Date: 04/29/2024 Age:25 hours Reason for consult: Initial assessment;Early term 37-38.6wks;Infant < 6lbs. See MOB: MR- VBAC and IUGR  Per MOB, infant breastfeed for 6 minutes at 2155 pm, she attempted supplement infant but he would not drink the 22 kcal formula using the yellow slow flow bottle nipple. LC gave MOB White Nfant nipple and infant was pace feed 13 mls of 22 kcal formula with the White Nfant nipple. MOB was set up with DEBP and fitted with 18 mm breast flange, MOB was pumping when LC left the room. LC discussed infant's input and output, Per MOB, infant had 4 voids and 2 stools since birth. MOB has hx of breastfeeding see maternal data below. LC discussed LPTI/LBW infant feeding guidelines with MOB. MOB has MY Feeding Plan  crib card and knows on day 1 ( 12-14 mls) per feeding or more if infant wants it. MOB knows that her EBM is safe for 4 hours at room temperature whereas formula once open is safe for only 1 hour. MOB was made aware of O/P services, breastfeeding support groups, community resources, and our phone # for post-discharge questions.    Current Feeding Plan: ETI and LBW 1- MOB will continue to follow LBW/LPTI feeding guidelines, continue to breastfeed infant every 3 hours, and limit breast and bottle feeding to 30 minutes or less. 2- MOB will continue to supplement infant after each feeding with any EBM first and then 22 kcal formula and continue to offer Day 1 ( 12-14 mls ) per feeding or more if infant wants it. 3- MOB will continue to use the DEBP every 3 hours for 15 minutes on initial setting and give infant back any EBM first before formula.   Maternal Data Has patient been taught Hand Expression?: Yes Does the patient have breastfeeding experience prior to this delivery?: Yes How long did the patient breastfeed?: Per MOB, she BF twins 1 month and 3 rd child  for 3 months and she supplemented with formula.  Feeding Mother's Current Feeding Choice: Breast Milk and Formula Nipple Type: Nfant Standard Flow (white)  LATCH Score  LC did not observe latch at 2155 pm.                  Lactation Tools Discussed/Used Tools: Pump;Flanges Flange Size: 18 Breast pump type: Double-Electric Breast Pump Pump Education: Setup, frequency, and cleaning;Milk Storage Reason for Pumping: Infant is ETI and less than 5 lbs 8 ounces Pumping frequency: MOB will continue to use the DEBP every 3 hours for 15 minutes.  Interventions Interventions: Breast feeding basics reviewed;Skin to skin;Expressed milk;DEBP;Education;LC Services brochure;Guidelines for Milk Supply and Pumping Schedule Handout;LPT handout/interventions;CDC milk storage guidelines;CDC Guidelines for Breast Pump Cleaning  Discharge Pump: DEBP;Personal  Consult Status Consult Status: Follow-up Date: 04/30/24 Follow-up type: In-patient    Grayce LULLA Batter 04/29/2024, 11:25 PM

## 2024-04-29 NOTE — Anesthesia Preprocedure Evaluation (Addendum)
 Anesthesia Evaluation  Patient identified by MRN, date of birth, ID band Patient awake    Reviewed: Allergy & Precautions, NPO status , Patient's Chart, lab work & pertinent test results  Airway Mallampati: II  TM Distance: >3 FB Neck ROM: Full    Dental no notable dental hx. (+) Teeth Intact, Dental Advisory Given   Pulmonary asthma , former smoker   Pulmonary exam normal breath sounds clear to auscultation       Cardiovascular negative cardio ROS Normal cardiovascular exam Rhythm:Regular Rate:Normal     Neuro/Psych   Anxiety        GI/Hepatic negative GI ROS, Neg liver ROS,,,  Endo/Other  negative endocrine ROS    Renal/GU negative Renal ROS     Musculoskeletal   Abdominal   Peds  Hematology Lab Results      Component                Value               Date                      WBC                      7.3                 04/29/2024                HGB                      12.9                04/29/2024                HCT                      37.3                04/29/2024                MCV                      85.6                04/29/2024                PLT                      186                 04/29/2024              Anesthesia Other Findings   Reproductive/Obstetrics (+) Pregnancy                              Anesthesia Physical Anesthesia Plan  ASA: 2  Anesthesia Plan: Epidural   Post-op Pain Management:    Induction:   PONV Risk Score and Plan:   Airway Management Planned:   Additional Equipment:   Intra-op Plan:   Post-operative Plan:   Informed Consent: I have reviewed the patients History and Physical, chart, labs and discussed the procedure including the risks, benefits and alternatives for the proposed anesthesia with the patient or authorized representative who has indicated his/her understanding and acceptance.       Plan Discussed with:  Anesthesia Plan Comments: (37.1 wk G4P2 for LEA for TOLAC)         Anesthesia Quick Evaluation

## 2024-04-29 NOTE — Plan of Care (Signed)

## 2024-04-29 NOTE — Progress Notes (Signed)
 Labor Progress Note Melisssa Kady is a 23 y.o. [phone] at [redacted]w[redacted]d presented for IOL due to IUGR 6%ile with elevated UAD, TOLAC.   S:  Coping well.   O:  BP (!) 97/51   Pulse 62   Temp (!) 97.2 F (36.2 C) (Oral)   Resp 18   Ht 5' 3 (1.6 m)   Wt 67.7 kg   LMP 08/13/2023   BMI 26.43 kg/m   EFM: baseline 125 bpm/ moderate variability/ 15x15 accels/ absent decels  Toco/IUPC: 3-5 SVE: Dilation: 4 Effacement (%): 60, 70 Station: -3 Presentation: Vertex Exam by:: Butler Potters CNM Pitocin : 0 mu/min  A/P: 23 y.o. H5E8886 [redacted]w[redacted]d IOL due to IUGR 6%ile with elevated UAD, TOLAC.  1. Labor: IOL in latent labor s/p FB expulsion. Desires expectant management for 4 hours while waiting on PCN adequacy.  AROM once adequately treated. Consider pitocin  PRN. 2. FWB: Cat 1 3. Pain: Coping well 4. GBS PCR returned pos, will treat prior to AROM.    Anticipate NVSB.  Camie DELENA Rote, CNM 4:24 AM

## 2024-04-29 NOTE — Discharge Summary (Signed)
 Postpartum Discharge Summary  Date of Service updated***     Patient Name: Jasmine Pierce DOB: Jan 03, 2001 MRN: [id number]  Date of admission: 04/29/2024 Delivery date:04/29/2024 Delivering provider: NICHOLAUS ALMARIE HERO Date of discharge: 04/29/2024  Admitting diagnosis: IUGR (intrauterine growth restriction) affecting care of mother [O36.5990] Intrauterine pregnancy: [redacted]w[redacted]d     Secondary diagnosis:  Principal Problem:   IUGR (intrauterine growth restriction) affecting care of mother  Additional problems: ***    Discharge diagnosis: Term Pregnancy Delivered and IUGR                                              Post partum procedures:{Postpartum procedures:23558} Augmentation: AROM and IP Foley Complications: None  Hospital course: Induction of Labor With Vaginal Delivery   23 y.o. yo [phone] at [redacted]w[redacted]d was admitted to the hospital 04/29/2024 for induction of labor.  Indication for induction: IUGR.  Patient had an labor course complicated by none. Membrane Rupture Time/Date: 8:55 AM,04/29/2024  Delivery Method:VBAC, Spontaneous Operative Delivery:N/A Episiotomy: None Lacerations:    Details of delivery can be found in separate delivery note.  Patient had a postpartum course complicated by***. Patient is discharged home 04/29/24.  Newborn Data: Birth date:04/29/2024 Birth time:2:02 PM Gender:Female Living status:Living Apgars:9 ,9  Weight:   Magnesium Sulfate received: No BMZ received: No Rhophylac:No MMR:No T-DaP:Given prenatally Flu: No RSV Vaccine received: No Transfusion:{Transfusion received:30440034}  Immunizations received: Immunization History  Administered Date(s) Administered   Influenza,inj,Quad PF,6+ Mos 07/27/2019   Tdap 01/12/2020, 03/09/2022, 03/14/2024    Physical exam  Vitals:   04/29/24 1337 04/29/24 1344 04/29/24 1420 04/29/24 1432  BP: 92/61 105/60 (!) 115/103 97/75  Pulse: 66 99  75  Resp:      Temp:      TempSrc:      Weight:      Height:        General: {Exam; general:21111117} Lochia: {Desc; appropriate/inappropriate:30686::appropriate} Uterine Fundus: {Desc; firm/soft:30687} Incision: {Exam; incision:21111123} DVT Evaluation: {Exam; dvt:2111122} Labs: Lab Results  Component Value Date   WBC 7.3 04/29/2024   HGB 12.9 04/29/2024   HCT 37.3 04/29/2024   MCV 85.6 04/29/2024   PLT 186 04/29/2024      Latest Ref Rng & Units 01/26/2023    4:41 PM  CMP  Glucose 70 - 99 mg/dL 96   BUN 6 - 20 mg/dL 8   Creatinine 9.42 - 8.99 mg/dL 9.26   Sodium 865 - 855 mmol/L 139   Potassium 3.5 - 5.2 mmol/L 4.1   Chloride 96 - 106 mmol/L 102   CO2 20 - 29 mmol/L 19   Calcium 8.7 - 10.2 mg/dL 9.6   Total Protein 6.0 - 8.5 g/dL 7.9   Total Bilirubin 0.0 - 1.2 mg/dL 0.3   Alkaline Phos 44 - 121 IU/L 58   AST 0 - 40 IU/L 24   ALT 0 - 32 IU/L 17    Edinburgh Score:    05/11/2022    9:40 AM  Edinburgh Postnatal Depression Scale Screening Tool  I have been able to laugh and see the funny side of things. 0  I have looked forward with enjoyment to things. 0  I have blamed myself unnecessarily when things went wrong. 1  I have been anxious or worried for no good reason. 0  I have felt scared or panicky for no good reason. 0  Things  have been getting on top of me. 0  I have been so unhappy that I have had difficulty sleeping. 0  I have felt sad or miserable. 0  I have been so unhappy that I have been crying. 0  The thought of harming myself has occurred to me. 0  Edinburgh Postnatal Depression Scale Total 1      Data saved with a previous flowsheet row definition   No data recorded  After visit meds:  Allergies as of 04/29/2024   No Known Allergies   Med Rec must be completed prior to using this [facility]***        Discharge home in stable condition Infant Feeding: {Baby feeding:23562} Infant Disposition:{CHL IP OB HOME WITH FNUYZM:76418} Discharge instruction: per After Visit Summary and Postpartum booklet. Activity:  Advance as tolerated. Pelvic rest for 6 weeks.  Diet: {OB ipzu:78888878} Future Appointments:No future appointments. Follow up Visit:  Message sent to FT 7/20  Please schedule this patient for a In person postpartum visit in 6 weeks with the following provider: Any provider. Additional Postpartum F/U:None  High risk pregnancy complicated by: IUGR Delivery mode:  VBAC, Spontaneous Anticipated Birth Control:  POPs   04/29/2024 Almarie CHRISTELLA Moats, MD

## 2024-04-29 NOTE — Anesthesia Procedure Notes (Signed)
 Epidural Patient location during procedure: OB Start time: 04/29/2024 10:36 AM End time: 04/29/2024 10:48 AM  Staffing Anesthesiologist: Jefm Garnette LABOR, MD Performed: anesthesiologist   Preanesthetic Checklist Completed: patient identified, IV checked, site marked, risks and benefits discussed, surgical consent, monitors and equipment checked, pre-op evaluation and timeout performed  Epidural Patient position: sitting Prep: DuraPrep and site prepped and draped Patient monitoring: continuous pulse ox and blood pressure Approach: midline Location: L3-L4 Injection technique: LOR air  Needle:  Needle type: Tuohy  Needle gauge: 17 G Needle length: 9 cm and 9 Needle insertion depth: 6 cm Catheter type: closed end flexible Catheter size: 19 Gauge Catheter at skin depth: 12 cm Test dose: negative  Assessment Events: blood not aspirated, no cerebrospinal fluid, injection not painful, no injection resistance, no paresthesia and negative IV test  Additional Notes Patient identified. Risks/Benefits/Options discussed with patient including but not limited to bleeding, infection, nerve damage, paralysis, failed block, incomplete pain control, headache, blood pressure changes, nausea, vomiting, reactions to medication both or allergic, itching and postpartum back pain. Confirmed with bedside nurse the patient's most recent platelet count. Confirmed with patient that they are not currently taking any anticoagulation, have any bleeding history or any family history of bleeding disorders. Patient expressed understanding and wished to proceed. All questions were answered. Sterile technique was used throughout the entire procedure. Please see nursing notes for vital signs. Test dose was given through epidural needle and negative prior to continuing to dose epidural or start infusion. Warning signs of high block given to the patient including shortness of breath, tingling/numbness in hands, complete  motor block, or any concerning symptoms with instructions to call for help. Patient was given instructions on fall risk and not to get out of bed. All questions and concerns addressed with instructions to call with any issues.  1 Attempt (S) . Patient tolerated procedure well.

## 2024-04-30 LAB — CULTURE, BETA STREP (GROUP B ONLY): Strep Gp B Culture: POSITIVE — AB

## 2024-04-30 LAB — CERVICOVAGINAL ANCILLARY ONLY
Chlamydia: NEGATIVE
Comment: NEGATIVE
Comment: NORMAL
Neisseria Gonorrhea: NEGATIVE

## 2024-04-30 NOTE — Social Work (Signed)
 CSW received consult for hx of Anxiety and Depression.  CSW met with MOB to offer support and complete assessment. CSW entered the room and observed MOB resting in bed and the infant in the bassinet. CSW introduced self, CSW role and reason for visit, MOB was agreeable to visit.  CSW inquired about how MOB was feeling, MOB reported good. CSW inquired about BO MH hx, MOB reported she experienced PPD after her last child MOB reported feeling extremely sad for a couple of months. MOB reported she was prescribed medication and she took it a few times then stopped, MOB reported she managed her symptoms and her mood improved. CSW assessed for safety, MOB denied any SI or HI. CSW provided education regarding the baby blues period vs. perinatal mood disorders, discussed treatment and gave resources for mental health follow up if concerns arise.  CSW recommends self-evaluation during the postpartum time period using the New Mom Checklist from Postpartum Progress and encouraged MOB to contact a medical professional if symptoms are noted at any time.  MOB identified her husband and dad as her primary supports    CSW provided review of Sudden Infant Death Syndrome (SIDS) precautions.  MOB reported she has all necessary items for the infant including a crib and car seat.  CSW identifies no further need for intervention and no barriers to discharge at this time.  Velma Agnes, LCSWA Clinical Social Worker [phone]

## 2024-04-30 NOTE — Anesthesia Postprocedure Evaluation (Signed)
 Anesthesia Post Note  Patient: Maricruz Serna  Procedure(s) Performed: AN AD HOC LABOR EPIDURAL     Patient location during evaluation: Mother Baby Anesthesia Type: Epidural Level of consciousness: awake and alert Pain management: pain level controlled Vital Signs Assessment: post-procedure vital signs reviewed and stable Respiratory status: spontaneous breathing, nonlabored ventilation and respiratory function stable Cardiovascular status: stable Postop Assessment: no headache, no backache, epidural receding and able to ambulate Anesthetic complications: no   No notable events documented.  Last Vitals:  Vitals:   04/30/24 0056 04/30/24 0400  BP: 95/66 103/65  Pulse: 68 98  Resp: 18 18  Temp: 36.8 C 36.8 C  SpO2: 100% 100%    Last Pain:  Vitals:   04/30/24 0519  TempSrc:   PainSc: 2    Pain Goal:                   Zafiro Routson

## 2024-04-30 NOTE — Lactation Note (Signed)
 This note was copied from a baby's chart. Lactation Consultation Note  Patient Name: Jasmine Pierce Date: 04/30/2024 Age:25 hours Reason for consult: Follow-up assessment;Early term 6-38.6wks RN stated mom had questions about flange size. Mom stated #18 was hurting especially the Lt. Nipple. LC assess and mom could probably use a smaller size like #15-16. Mom didn't have a smaller size than 17. Suggested mom try it or order smaller inserts. Mom is using coconut oil. Encouraged to use on flanges. Mom stated she is. Discussed mom pumping and mom collecting good amount. Praised mom. Mom is BF then supplementing. Reviewed plan: Mom BF no longer than 10 minutes Supplement colostrum,  Give formula to equal amount needed and more. Mom understands not to mix colostrum and formula. Pump for colostrum for next feeding. If mom has questions or needs assistance to call.  Maternal Data Does the patient have breastfeeding experience prior to this delivery?: Yes  Feeding Mother's Current Feeding Choice: Breast Milk and Formula Nipple Type: Nfant Standard Flow (white)  LATCH Score       Type of Nipple: Everted at rest and after stimulation  Comfort (Breast/Nipple): Filling, red/small blisters or bruises, mild/mod discomfort (sore)         Lactation Tools Discussed/Used Flange Size: 18  Interventions Interventions: Breast feeding basics reviewed;Education  Discharge    Consult Status Consult Status: Follow-up Date: 05/01/24 Follow-up type: In-patient    Camya Haydon G 04/30/2024, 10:17 PM

## 2024-04-30 NOTE — Progress Notes (Signed)
 Post Partum Day 1  Subjective: Doing well. No acute events overnight. Pain is controlled and bleeding is appropriate. She is eating, drinking, voiding, and ambulating without issue. She is bottle feeding which seems to be going ok. She has no other concerns at this time.  Objective: Blood pressure 103/65, pulse 98, temperature 98.2 F (36.8 C), temperature source Oral, resp. rate 18, height 5' 3 (1.6 m), weight 67.7 kg, last menstrual period 08/13/2023, SpO2 100%, unknown if currently breastfeeding.  Physical Exam:  General: alert, cooperative, and no distress CV: RRR Lungs: normal respiratory effort, CTAB Lochia: appropriate Uterine Fundus: firm Incision: NA DVT Evaluation: No evidence of DVT seen on physical exam.  Recent Labs    04/29/24 0118  HGB 12.9  HCT 37.3    Assessment/Plan: Jasmine Pierce is a 23 y.o. [id number] on PPD# 1 s/p VBAC.  Progressing well. Meeting postpartum milestones. VSS. Continue routine postpartum care.  Feeding: bottle feeding Contraception: POPs Circumcision:  yes- consent completed this am  Dispo: Routine postpartum care, possible discharge pending baby status though likely to stay for an additional day.   LOS: 1 day   Jasmine Pierce M Eion Timbrook, DO  04/30/2024, 8:54 AM

## 2024-04-30 NOTE — Patient Instructions (Signed)
 If interested in an outpatient lactation consult in office or virtually please reach out to us  at [facility] (First Floor) [address], [place] Please call [phone] and press [phone] for lactation.   -- Lactation support groups:  [facility], Tuesdays 10:00 am -12:00 pm at [address] on the second floor in the conference room, lactating parents and lap babies welcome.  [email or web address]  [email or web address]     Jasmine Pierce, [facility]

## 2024-05-01 DIAGNOSIS — O34219 Maternal care for unspecified type scar from previous cesarean delivery: Principal | ICD-10-CM | POA: Diagnosis not present

## 2024-05-01 MED ORDER — NORETHINDRONE 0.35 MG PO TABS
1.0000 | ORAL_TABLET | Freq: Every day | ORAL | 11 refills | Status: DC
Start: 2024-05-01 — End: 2024-06-14

## 2024-05-01 MED ORDER — IBUPROFEN 600 MG PO TABS: 600.0000 mg | ORAL_TABLET | Freq: Four times a day (QID) | ORAL | 0 refills | Status: AC

## 2024-05-01 NOTE — Lactation Note (Signed)
 This note was copied from a baby's chart. Lactation Consultation Note  Patient Name: Jasmine Pierce Unijb'd Date: 05/01/2024 Age:25 hours  Reason for consult: Follow-up assessment;Early term 37-38.6wks;Infant < 6lbs  P4, [redacted]w[redacted]d, 3% weight loss (gained 24g since last weight)  Follow up LC visit. Mother was bottle feeding baby Jasmine Enerio with her expressed milk upon entry to room. Baby was tolerating bottle feeding well. Her milk volume is increasing. Mother has experience with breastfeeding, pumping, and formula feeding due to all of her babies being low birth weight.   Mother reports she has been latching baby, feeding him her expressed milk and supplementing with formula. She didn't latch him with this last feeding due to Enerio being fussy and needing to go for his circumcision.   Discussed management of engorgement, signs and symptom of mastitis, and if occurs, to report to patient's OB provider. Discussed Lactogenesis II/ supply and demand for maintaining milk supply.   Mom made aware of O/P services, breastfeeding support groups, community resources, and our phone # for post-discharge questions.        Feeding Mother's Current Feeding Choice: Breast Milk and Formula Nipple Type: Nfant Standard Flow (white)   Lactation Tools Discussed/Used Reason for Pumping: stimulate milk production and supplement Pumping frequency: every 3 hrs, prefers hand pump Pumped volume: 23 mL  Interventions Interventions: Education  Discharge Discharge Education: Engorgement and breast care;Warning signs for feeding baby;Outpatient recommendation Pump: DEBP;Personal;Manual  Consult Status Consult Status: Complete Date: 05/01/24    Joshua Rojelio HERO 05/01/2024, 9:17 AM

## 2024-05-02 LAB — SURGICAL PATHOLOGY

## 2024-05-16 ENCOUNTER — Telehealth (HOSPITAL_COMMUNITY): Payer: Self-pay | Admitting: *Deleted

## 2024-05-16 NOTE — Telephone Encounter (Signed)
 05/16/2024  Name: Jasmine Pierce MRN: [id number] DOB: [birth year]  Reason for Call:  Transition of Care Hospital Discharge Call  Contact Status: Patient Contact Status: Complete  Language assistant needed: Interpreter Mode: Interpreter Not Needed        Follow-Up Questions: Do You Have Any Concerns About Your Health As You Heal From Delivery?: No Do You Have Any Concerns About Your Infants Health?: No  Edinburgh Postnatal Depression Scale:  In the Past 7 Days:    PHQ2-9 Depression Scale:     Discharge Follow-up: Edinburgh score requires follow up?:  (says answers are the same as in the hospital when score was 0, endorses she is doing well emotionally) Patient was advised of the following resources:: Support Group, Breastfeeding Support Group  Post-discharge interventions: Reviewed Newborn Safe Sleep Practices  Mliss Sieve, RN 05/16/2024 15:15

## 2024-06-13 ENCOUNTER — Ambulatory Visit: Admitting: Obstetrics and Gynecology

## 2024-06-13 ENCOUNTER — Encounter: Payer: Self-pay | Admitting: Obstetrics & Gynecology

## 2024-06-14 ENCOUNTER — Encounter: Payer: Self-pay | Admitting: Women's Health

## 2024-06-14 ENCOUNTER — Ambulatory Visit (INDEPENDENT_AMBULATORY_CARE_PROVIDER_SITE_OTHER): Admitting: Women's Health

## 2024-06-14 DIAGNOSIS — Z98891 History of uterine scar from previous surgery: Secondary | ICD-10-CM | POA: Diagnosis not present

## 2024-06-14 DIAGNOSIS — Z3202 Encounter for pregnancy test, result negative: Secondary | ICD-10-CM

## 2024-06-14 DIAGNOSIS — Z3042 Encounter for surveillance of injectable contraceptive: Secondary | ICD-10-CM | POA: Diagnosis not present

## 2024-06-14 DIAGNOSIS — Z87898 Personal history of other specified conditions: Secondary | ICD-10-CM

## 2024-06-14 DIAGNOSIS — Z30013 Encounter for initial prescription of injectable contraceptive: Secondary | ICD-10-CM

## 2024-06-14 LAB — POCT URINE PREGNANCY: Preg Test, Ur: NEGATIVE

## 2024-06-14 MED ORDER — MEDROXYPROGESTERONE ACETATE 150 MG/ML IM SUSP: 150.0000 mg | INTRAMUSCULAR | 3 refills | Status: AC

## 2024-06-14 MED ORDER — MEDROXYPROGESTERONE ACETATE 150 MG/ML IM SUSY
150.0000 mg | PREFILLED_SYRINGE | Freq: Once | INTRAMUSCULAR | Status: AC
  Administered 2024-06-14: 150 mg via INTRAMUSCULAR

## 2024-06-14 NOTE — Progress Notes (Signed)
 POSTPARTUM VISIT Patient name: Jasmine Pierce MRN [id number]  Date of birth: February 08, 2001 Chief Complaint:   Postpartum Care (Not good taking pill, want depo)  History of Present Illness:   Kizzi Margert Edsall is a 23 y.o. [phone] African American female being seen today for a postpartum visit. She is 6 weeks postpartum following a vaginal birth after cesarean (VBAC) at 37.1 gestational weeks. IOL: yes, for elevated dopplers  and fetal growth restriction . Anesthesia: epidural.  Laceration: none.  Complications: none. Inpatient contraception: no.   Pregnancy complicated by FGR. Tobacco use: no. Substance use disorder: no. Last pap smear: 01/26/23 and results were NILM w/ HRHPV not done. Next pap smear due: 2027 Patient's last menstrual period was 05/31/2024.  Postpartum course has been uncomplicated. Bleeding none. Bowel function is normal. Bladder function is normal. Urinary incontinence? no, fecal incontinence? no Patient is sexually active. Last sexual activity: 2d ago. Desired contraception: trouble remembering POPs, wants depo. Patient does not want a pregnancy in the future.  Desired family size is 4 children.   Upstream - 06/14/24 1538       Pregnancy Intention Screening   Does the patient want to become pregnant in the next year? No    Does the patient's partner want to become pregnant in the next year? No    Would the patient like to discuss contraceptive options today? No      Contraception Wrap Up   Current Method Hormonal Injection    End Method Hormonal Injection    Contraception Counseling Provided Yes         The pregnancy intention screening data noted above was reviewed. Potential methods of contraception were discussed. The patient elected to proceed with Hormonal Injection.  Edinburgh Postpartum Depression Screening: negative  Edinburgh Postnatal Depression Scale - 06/14/24 1530       Edinburgh Postnatal Depression Scale:  In the Past 7 Days   I have been  able to laugh and see the funny side of things. 0    I have looked forward with enjoyment to things. 0    I have blamed myself unnecessarily when things went wrong. 0    I have been anxious or worried for no good reason. 0    I have felt scared or panicky for no good reason. 0    Things have been getting on top of me. 0    I have been so unhappy that I have had difficulty sleeping. 0    I have felt sad or miserable. 0    I have been so unhappy that I have been crying. 0    The thought of harming myself has occurred to me. 0    Edinburgh Postnatal Depression Scale Total 0             11/07/2023   11:02 AM 01/26/2023    3:53 PM 05/10/2022    9:52 AM 03/31/2020   10:39 AM  GAD 7 : Generalized Anxiety Score  Nervous, Anxious, on Edge 0 1 1 0  Control/stop worrying 0 2 1 1   Worry too much - different things 0 2 0 1  Trouble relaxing 0 1 1 1   Restless 0 1 0 0  Easily annoyed or irritable 0 3 1 2   Afraid - awful might happen 0 1 1 2   Total GAD 7 Score 0 11 5 7   Anxiety Difficulty    Not difficult at all     Baby's course has been uncomplicated. Baby is  feeding by bottle. Infant has a pediatrician/family doctor? Yes.  Childcare strategy if returning to work/school: daycare.  Pt has material needs met for her and baby: Yes.   Review of Systems:   Pertinent items are noted in HPI Denies Abnormal vaginal discharge w/ itching/odor/irritation, headaches, visual changes, shortness of breath, chest pain, abdominal pain, severe nausea/vomiting, or problems with urination or bowel movements. Pertinent History Reviewed:  Reviewed past medical,surgical, obstetrical and family history.  Reviewed problem list, medications and allergies. OB History  Gravida Para Term Preterm AB Living  4 3 2 1 1 4   SAB IAB Ectopic Multiple Live Births  1 0 0 1 4    # Outcome Date GA Lbr Len/2nd Weight Sex Type Anes PTL Lv  4 Term 04/29/24 [redacted]w[redacted]d  5 lb 7.1 oz (2.47 kg) M VBAC EPI  LIV  3 Term 05/10/22 [redacted]w[redacted]d /  00:16 5 lb 4.1 oz (2.385 kg) M VBAC EPI  LIV     Birth Comments: wnl  2A Preterm 01/09/20 [redacted]w[redacted]d  5 lb 9.8 oz (2.545 kg) M CS-LTranv Spinal  LIV  2B Preterm 01/09/20 [redacted]w[redacted]d  4 lb 11.3 oz (2.135 kg) M CS-LTranv Spinal  LIV  1 SAB            Physical Assessment:   Vitals:   06/14/24 1535  BP: 113/77  Pulse: 70  Weight: 134 lb (60.8 kg)  Height: 5' 3 (1.6 m)  Body mass index is 23.74 kg/m.       Physical Examination:   General appearance: alert, well appearing, and in no distress  Mental status: alert, oriented to person, place, and time  Skin: warm & dry   Cardiovascular: normal heart rate noted   Respiratory: normal respiratory effort, no distress   Breasts: deferred, no complaints   Abdomen: soft, non-tender   Pelvic: examination not indicated. Thin prep pap obtained: No  Rectal: not examined  Extremities: Edema: none   Chaperone: N/A       No results found for this or any previous visit (from the past 24 hours).  Assessment & Plan:  1) Postpartum exam 2) 6 wks s/p vaginal birth after cesarean (VBAC) after IOL for FGR 3) bottle feeding 4) Depression screening 5) Contraception management: depo today, rx sent, condoms x 2wks, next dose 11-13wks. Take HPT in 2wks (has missed pills, had sex 2d ago)  Essential components of care per ACOG recommendations:  1.  Mood and well being:  If positive depression screen, discussed and plan developed.  If using tobacco we discussed reduction/cessation and risk of relapse If current substance abuse, we discussed and referral to local resources was offered.   2. Infant care and feeding:  If breastfeeding, discussed returning to work, pumping, breastfeeding-associated pain, guidance regarding return to fertility while lactating if not using another method. If needed, patient was provided with a letter to be allowed to pump q 2-3hrs to support lactation in a private location with access to a refrigerator to store breastmilk.   Recommended  that all caregivers be immunized for flu, pertussis and other preventable communicable diseases If pt does not have material needs met for her/baby, referred to local resources for help obtaining these.  3. Sexuality, contraception and birth spacing Provided guidance regarding sexuality, management of dyspareunia, and resumption of intercourse Discussed avoiding interpregnancy interval <4mths and recommended birth spacing of 18 months  4. Sleep and fatigue Discussed coping options for fatigue and sleep disruption Encouraged family/partner/community support of 4 hrs of uninterrupted  sleep to help with mood and fatigue  5. Physical recovery  If pt had a C/S, assessed incisional pain and providing guidance on normal vs prolonged recovery If pt had a laceration, perineal healing and pain reviewed.  If urinary or fecal incontinence, discussed management and referred to PT or uro/gyn if indicated  Patient is safe to resume physical activity. Discussed attainment of healthy weight.  6.  Chronic disease management Discussed pregnancy complications if any, and their implications for future childbearing and long-term maternal health. Review recommendations for prevention of recurrent pregnancy complications, such as 17 hydroxyprogesterone caproate to reduce risk for recurrent PTB not applicable, or aspirin to reduce risk of preeclampsia not applicable. Pt had GDM: no. If yes, 2hr GTT scheduled: not applicable. Reviewed medications and non-pregnant dosing including consideration of whether pt is breastfeeding using a reliable resource such as LactMed: not applicable Referred for f/u w/ PCP or subspecialist providers as indicated: not applicable  7. Health maintenance Mammogram at 25yo or earlier if indicated Pap smears as indicated  Meds:  Meds ordered this encounter  Medications   medroxyPROGESTERone  (DEPO-PROVERA ) 150 MG/ML injection    Sig: Inject 1 mL (150 mg total) into the muscle every 3  (three) months.    Dispense:  1 mL    Refill:  3    Follow-up: Return in about 1 year (around 06/14/2025) for 11-13wks for Depo injection; , Physical.   No orders of the defined types were placed in this encounter.   Suzen JONELLE Fetters CNM, [facility] 06/14/2024 3:49 PM

## 2024-06-14 NOTE — Addendum Note (Signed)
 Addended by: NEYSA CLARITA RAMAN on: 06/14/2024 04:06 PM   Modules accepted: Orders

## 2024-09-05 ENCOUNTER — Ambulatory Visit

## 2024-11-15 ENCOUNTER — Encounter: Payer: Self-pay | Admitting: Adult Health
